# Patient Record
Sex: Female | Born: 1950 | Race: Black or African American | Hispanic: No | Marital: Single | Smoking: Never smoker
Health system: Southern US, Community
[De-identification: ages and names within clinical notes are randomized; demographics above are authoritative.]

## PROBLEM LIST (undated history)

## (undated) DIAGNOSIS — D649 Anemia, unspecified: Secondary | ICD-10-CM

## (undated) DIAGNOSIS — M1990 Unspecified osteoarthritis, unspecified site: Secondary | ICD-10-CM

## (undated) DIAGNOSIS — I10 Essential (primary) hypertension: Secondary | ICD-10-CM

## (undated) DIAGNOSIS — K219 Gastro-esophageal reflux disease without esophagitis: Secondary | ICD-10-CM

## (undated) HISTORY — DX: Anemia, unspecified: D64.9

## (undated) HISTORY — DX: Gastro-esophageal reflux disease without esophagitis: K21.9

## (undated) HISTORY — PX: DENTAL RESTORATION/EXTRACTION WITH X-RAY: SHX5796

## (undated) HISTORY — PX: COLONOSCOPY: SHX174

## (undated) HISTORY — PX: CHOLECYSTECTOMY: SHX55

## (undated) HISTORY — DX: Essential (primary) hypertension: I10

## (undated) HISTORY — PX: REPAIR PERONEAL TENDONS ANKLE: SUR1201

## (undated) HISTORY — PX: APPENDECTOMY: SHX54

## (undated) HISTORY — DX: Unspecified osteoarthritis, unspecified site: M19.90

## (undated) HISTORY — PX: UPPER GASTROINTESTINAL ENDOSCOPY: SHX188

---

## 1998-05-01 ENCOUNTER — Encounter: Payer: Self-pay | Admitting: Family Medicine

## 1998-05-01 ENCOUNTER — Ambulatory Visit (HOSPITAL_COMMUNITY): Admission: RE | Admit: 1998-05-01 | Discharge: 1998-05-01 | Payer: Self-pay | Admitting: Family Medicine

## 1998-05-11 ENCOUNTER — Ambulatory Visit (HOSPITAL_COMMUNITY): Admission: RE | Admit: 1998-05-11 | Discharge: 1998-05-11 | Payer: Self-pay | Admitting: Family Medicine

## 1999-06-22 ENCOUNTER — Emergency Department (HOSPITAL_COMMUNITY): Admission: EM | Admit: 1999-06-22 | Discharge: 1999-06-22 | Payer: Self-pay | Admitting: Emergency Medicine

## 2001-05-11 ENCOUNTER — Other Ambulatory Visit: Admission: RE | Admit: 2001-05-11 | Discharge: 2001-05-11 | Payer: Self-pay | Admitting: Obstetrics & Gynecology

## 2001-06-11 HISTORY — PX: TOTAL ABDOMINAL HYSTERECTOMY: SHX209

## 2001-06-18 ENCOUNTER — Inpatient Hospital Stay (HOSPITAL_COMMUNITY): Admission: RE | Admit: 2001-06-18 | Discharge: 2001-06-20 | Payer: Self-pay | Admitting: Obstetrics & Gynecology

## 2001-06-18 ENCOUNTER — Encounter (INDEPENDENT_AMBULATORY_CARE_PROVIDER_SITE_OTHER): Payer: Self-pay | Admitting: Specialist

## 2002-04-12 LAB — HM COLONOSCOPY: HM Colonoscopy: NORMAL

## 2004-01-17 ENCOUNTER — Emergency Department (HOSPITAL_COMMUNITY): Admission: EM | Admit: 2004-01-17 | Discharge: 2004-01-17 | Payer: Self-pay | Admitting: Emergency Medicine

## 2007-12-08 ENCOUNTER — Ambulatory Visit (HOSPITAL_COMMUNITY): Admission: RE | Admit: 2007-12-08 | Discharge: 2007-12-08 | Payer: Self-pay | Admitting: Internal Medicine

## 2008-12-29 ENCOUNTER — Ambulatory Visit (HOSPITAL_COMMUNITY): Admission: RE | Admit: 2008-12-29 | Discharge: 2008-12-29 | Payer: Self-pay | Admitting: Internal Medicine

## 2010-01-31 ENCOUNTER — Ambulatory Visit (HOSPITAL_COMMUNITY)
Admission: RE | Admit: 2010-01-31 | Discharge: 2010-01-31 | Payer: Self-pay | Source: Home / Self Care | Attending: Internal Medicine | Admitting: Internal Medicine

## 2010-01-31 LAB — HM MAMMOGRAPHY: HM Mammogram: NEGATIVE

## 2010-06-29 NOTE — Op Note (Signed)
[facility]  Patient:    Sierra Shields, Sierra Shields Visit Number: [id number] MRN: [id number]          Service Type: GYN Location: 9300 [id number] Attending Physician:  Lars Pinks Dictated by:   Caralyn Guile Arlyce Dice, M.D. Proc. Date: 06/18/01 Admit Date:  06/18/2001                             Operative Report  PREOPERATIVE DIAGNOSIS:       Myoma uteri and menorrhagia.  POSTOPERATIVE DIAGNOSIS:      Myoma uteri and menorrhagia.  OPERATION:                    Total abdominal hysterectomy and bilateral                               salpingo-oophorectomy.  SURGEON:                      Richard D. Arlyce Dice, M.D.  ASSISTANT:                    Luvenia Redden, M.D.  ANESTHESIA:                   General endotracheal.  ESTIMATED BLOOD LOSS:         200 cc.  FINDINGS:                     A large, approximately 20 week size uterus with multiple leiomyoma.  Tubes and ovaries appeared normal.  The pelvis and abdomen appeared normal.  INDICATIONS:                  This is a 60 year old gravida 2, para 1, who noted increasingly heavy periods over the last six to eight months.  On evaluation, she was found to have large uterine myoma.  The decision was made to do a hysterectomy because of the patients age and a prophylactic bilateral salpingo-oophorectomy will be performed as well to decrease the risk of later ovarian cancer.  DESCRIPTION OF PROCEDURE:     The patient was taken to the operating room and placed in the supine position.  General endotracheal anesthesia was induced. She was then placed in the frog-leg position.  The abdomen was the prepped and the bladder was catheterized.  The patient was then draped and the low transverse incision was made and carried down to the fascia which was extended transversely.  The rectus muscle was dissected free from the overlying rectus sheath and then divided in the midline where the peritoneum was then  entered bluntly and extended sharply in a vertical fashion.  The uterus was then delivered through the incision.  The round ligaments were identified bilaterally, ligated, and cut.  The bladder plane was then created in the anterior portion of the uterus.  The infundibulopelvic ligaments were isolated bilaterally, clamped, and doubly ligated.  The uterine arteries were then skeletonized, clamped, and ligated.  The fundus was then amputated from the cervical stump to aid in exposure.  At this point, the self-retaining retractor was placed, and the bowel was packed.  The paracervical tissues were clamped, cut, and ligated.  The vagina was entered sharply, and using the Satinsky scissors, the cervical stump was removed.  The vaginal angles were closed with pop-off sutures  and the vaginal cuff was then closed with figure-of-eight sutures.  Hemostasis was noted to be present.  The packs removed.  The self-retaining retractor was removed.  The peritoneum was closed along with the rectus muscle in the midline with running 0 Vicryl suture.  The fascia was closed with 0 Vicryl suture and the skin was closed with subcuticular 4-0 Vicryl suture.  The patient tolerated the procedure well and left the operating room in good condition. Dictated by:   Caralyn Guile Arlyce Dice, M.D. Attending Physician:  Lars Pinks DD:  06/18/01 TD:  06/20/01 Job: [phone]

## 2010-06-29 NOTE — Discharge Summary (Signed)
[facility]  Patient:    Sierra Shields, CARRETO Visit Number: [id number] MRN: [id number]          Service Type: GYN Location: 9300 [id number] Attending Physician:  Lars Pinks Dictated by:   Caralyn Guile Arlyce Dice, M.D. Admit Date:  06/18/2001 Discharge Date: 06/20/2001                             Discharge Summary  FINAL DIAGNOSES:              1. Myoma uteri.                               2. Chronic hypertension.  PROCEDURE:                    Total abdominal hysterectomy, bilateral                               salpingo-oophorectomy.  COMPLICATIONS:                None.  CONDITION ON DISCHARGE:       Improved.  HISTORY OF PRESENT ILLNESS:   This is a 60 year old, Gravida 2, Para 1, AB 1, who was admitted for total abdominal hysterectomy and bilateral salpingo-oophorectomy for the treatment of large symptomatic uterine myoma, menorrhagia and anemia.  The patient had noted increasingly heavy periods for the last six to eight months.  She was evaluated by her family doctor where myoma were identified.  These were confirmed by preoperative examination in my office.  HOSPITAL COURSE:              The patient was taken to the operating room on the day of admission where a total abdominal hysterectomy and bilateral salpingo-oophorectomy were performed by Dr. Ilda Mori with the assistance of Dr. Lodema Hong.  The surgery went without complication.  The patients postoperative course was benign without significant fever or anemia. On the second postoperative day, the patient was ambulating well.  Her pain was controlled with oral analgesic and she had had a bowel movement and was tolerating food without problem.  She was therefore felt to be ready for discharge.  She was discharged on a regular diet.  She was told to limit her activity.  She was given Tylox 30 tablets to take one to two every four hours for pain.  She was asked to take  over-the-counter iron supplementation and she was given Climara 0.1 mg patches to apply weekly.  She was asked to call the office to make an appointment for follow up in three to four weeks.  LABORATORY DATA:              Revealed an admission hemoglobin of 10.3 with a white count of 7600.  Her discharge hemoglobin was 7.6 with a white count of 7200.  Her preoperative chemistries were all normal, renal and liver function. Her urinalysis was negative.  Her pathology is pending at the time of this dictation. Dictated by:   Caralyn Guile Arlyce Dice, M.D. Attending Physician:  Lars Pinks DD:  06/20/01 TD:  06/23/01 Job: [id number]

## 2010-06-29 NOTE — H&P (Signed)
[facility]  Patient:    Sierra Shields, Sierra Shields Visit Number: [id number] MRN: [id number]          Service Type: GYN Location: 9300 [id number] Attending Physician:  Lars Pinks Dictated by:   Caralyn Guile Arlyce Dice, M.D. Admit Date:  06/18/2001                           History and Physical  CHIEF COMPLAINT:              Myoma uteri.  HISTORY OF PRESENT ILLNESS:   This is a 60 year old, gravida 2, para 1, AB 1, who is admitted for total abdominal hysterectomy and bilateral salpingo-oophorectomy for the treatment of large, symptomatic uterine myoma. The patient was seen in the office on May 11, 2001, with the complaints of abdominal pain and heavy periods that last two weeks.  The patient has been having this problem for several months.  The patient has seen her family doctor, who did an ultrasound which revealed significant myomas on the uterus.  PAST MEDICAL HISTORY:         Previous surgeries include gallstones for which she had cholecystectomy in April 1972.  Her medical problems include hypertension which is well-controlled on antihypertensive medication.  She has no known drug allergies.  She has never received blood.  FAMILY HISTORY:               Positive for diabetes in her mother and father and heart disease in her mother, and she has a strong family history of hypertension and has been diagnoses with this as well.  SOCIAL HISTORY:               She is a nonsmoker.  She drinks alcohol only occasionally.  She drinks 4-5 cups of coffee a day.  Denies IV drug use or the use of addicting drugs.  REVIEW OF SYSTEMS:            Positive for night sweats and weight changes. Otherwise is negative.  PHYSICAL EXAMINATION:  VITAL SIGNS:                  She is 5 feet 7 inches tall, 235 pounds, blood pressure 140/90.  ENT:                          Normal.  NECK:                         Supple without thyromegaly.  BREASTS:                       Normal without masses.  HEART:                        Regular sinus rhythm with murmurs or gallops.  LUNGS:                        Clear to auscultation and percussion.  ABDOMEN:                      A 20 week size myomatous uterus.  She had no lymphadenopathy.  EXTERNAL GENITALIA:           Normal.  PELVIC:  Vagina is normal.  Her cervix was difficult to visualize and was very high in the pelvis.  Her uterus was irregular consistent with large uterine myoma.  LABORATORY DATA:              Anemia with a hemoglobin of 9.9, and she had an MRI which showed leiomyoma uteri.  IMPRESSION:                   Symptomatic uterine myoma.  PLAN:                         Proceed with total abdominal hysterectomy. Discussion about bilateral salpingo-oophorectomy was undertaken with the patient, and the decision was made to proceed with bilateral salpingo-oophorectomy for ovarian cancer prophylaxis.  The risks of surgery were discussed with the patient, and she voiced understanding of these risks prior to surgery. Dictated by:   Caralyn Guile Arlyce Dice, M.D. Attending Physician:  Lars Pinks DD:  06/17/01 TD:  06/17/01 Job: [phone]

## 2010-10-22 LAB — BASIC METABOLIC PANEL: Glucose: 100 mg/dL

## 2011-01-08 ENCOUNTER — Other Ambulatory Visit: Payer: Self-pay | Admitting: Internal Medicine

## 2011-01-08 DIAGNOSIS — Z1231 Encounter for screening mammogram for malignant neoplasm of breast: Secondary | ICD-10-CM

## 2011-02-11 ENCOUNTER — Ambulatory Visit (HOSPITAL_COMMUNITY): Payer: BC Managed Care – PPO | Attending: Internal Medicine

## 2011-02-25 ENCOUNTER — Other Ambulatory Visit: Payer: Self-pay | Admitting: Emergency Medicine

## 2011-02-25 DIAGNOSIS — Z1231 Encounter for screening mammogram for malignant neoplasm of breast: Secondary | ICD-10-CM

## 2011-02-26 ENCOUNTER — Ambulatory Visit (HOSPITAL_COMMUNITY)
Admission: RE | Admit: 2011-02-26 | Discharge: 2011-02-26 | Disposition: A | Discharge status: Home / Self Care | Payer: BC Managed Care – PPO | Source: Ambulatory Visit | Attending: Emergency Medicine | Admitting: Emergency Medicine

## 2011-02-26 DIAGNOSIS — Z1231 Encounter for screening mammogram for malignant neoplasm of breast: Secondary | ICD-10-CM | POA: Insufficient documentation

## 2011-04-03 ENCOUNTER — Encounter: Payer: Self-pay | Admitting: *Deleted

## 2011-04-03 DIAGNOSIS — J309 Allergic rhinitis, unspecified: Secondary | ICD-10-CM | POA: Insufficient documentation

## 2011-04-03 DIAGNOSIS — K219 Gastro-esophageal reflux disease without esophagitis: Secondary | ICD-10-CM

## 2011-04-03 DIAGNOSIS — I10 Essential (primary) hypertension: Secondary | ICD-10-CM

## 2011-04-03 DIAGNOSIS — D649 Anemia, unspecified: Secondary | ICD-10-CM | POA: Insufficient documentation

## 2011-04-04 ENCOUNTER — Encounter: Payer: Self-pay | Admitting: Family Medicine

## 2011-04-04 ENCOUNTER — Ambulatory Visit: Payer: BC Managed Care – PPO

## 2011-04-04 ENCOUNTER — Ambulatory Visit (INDEPENDENT_AMBULATORY_CARE_PROVIDER_SITE_OTHER): Payer: BC Managed Care – PPO | Admitting: Family Medicine

## 2011-04-04 VITALS — BP 147/87 | HR 66 | Temp 97.7°F | Resp 16 | Ht 65.25 in | Wt 298.8 lb

## 2011-04-04 DIAGNOSIS — M1712 Unilateral primary osteoarthritis, left knee: Secondary | ICD-10-CM | POA: Insufficient documentation

## 2011-04-04 DIAGNOSIS — M25569 Pain in unspecified knee: Secondary | ICD-10-CM

## 2011-04-04 DIAGNOSIS — M5137 Other intervertebral disc degeneration, lumbosacral region: Secondary | ICD-10-CM

## 2011-04-04 DIAGNOSIS — Z1211 Encounter for screening for malignant neoplasm of colon: Secondary | ICD-10-CM

## 2011-04-04 DIAGNOSIS — M1990 Unspecified osteoarthritis, unspecified site: Secondary | ICD-10-CM

## 2011-04-04 DIAGNOSIS — M1711 Unilateral primary osteoarthritis, right knee: Secondary | ICD-10-CM | POA: Insufficient documentation

## 2011-04-04 DIAGNOSIS — Z8639 Personal history of other endocrine, nutritional and metabolic disease: Secondary | ICD-10-CM

## 2011-04-04 DIAGNOSIS — Z0000 Encounter for general adult medical examination without abnormal findings: Secondary | ICD-10-CM

## 2011-04-04 DIAGNOSIS — E669 Obesity, unspecified: Secondary | ICD-10-CM

## 2011-04-04 DIAGNOSIS — I10 Essential (primary) hypertension: Secondary | ICD-10-CM

## 2011-04-04 LAB — POCT URINALYSIS DIPSTICK
Ketones, UA: NEGATIVE
Leukocytes, UA: NEGATIVE
Nitrite, UA: NEGATIVE
Protein, UA: 30
Urobilinogen, UA: 0.2
pH, UA: 6

## 2011-04-04 LAB — LIPID PANEL
HDL: 54 mg/dL (ref >39)
Total CHOL/HDL Ratio: 3 Ratio
Triglycerides: 132 mg/dL (ref <150)

## 2011-04-04 LAB — BASIC METABOLIC PANEL
BUN: 16 mg/dL (ref 6–23)
CO2: 28 mEq/L (ref 19–32)
Calcium: 10.2 mg/dL (ref 8.4–10.5)
Creat: 0.76 mg/dL (ref 0.50–1.10)
Glucose, Bld: 95 mg/dL (ref 70–99)
Sodium: 139 mEq/L (ref 135–145)

## 2011-04-04 MED ORDER — TRAMADOL HCL 50 MG PO TABS: 50.0000 mg | ORAL_TABLET | Freq: Three times a day (TID) | ORAL | Status: AC | PRN

## 2011-04-04 NOTE — Patient Instructions (Signed)

## 2011-04-04 NOTE — Progress Notes (Signed)
Subjective:    Patient ID: Sierra Shields, female    DOB: Jul 22, 1950, 61 y.o.   MRN: [id number]  HPI   This 61 y.o AA female presents for physical exam with specific concerns regarding obesity and chronic  knee pain. She has HTN , is compliant with medications and is having no chest pain, dizziness or palpitations.  She is requesting a specific diet pill that she and her daughter have researched; attempts to lose weight in the past   have included Weight Watchers ( pt lost 30 lbs but regained it) and Slim Fast. She has always regained any  weight that she lost, even with an exercise program. She used to do aquatic aerobics but developed a skin infection  and once that was cleared, she lost her enthusiasm for that form of exercise. She is very limited now because of  bilateral knee pain; she works as a Production designer, theatre/television/film at [employer] x 19 years and has joint pain related to her work.   She wears compression hose and orthotics but has persistent pain, not relieved by current NSAIDs. In the  past, she has tried Celebrex, Meloxicam and Naproxen without success. She sees an Orthopedist for upper  extremity problem and ankle pain but never had her knees evaluated.  Review of Systems  Constitutional: Positive for activity change and unexpected weight change. Negative for appetite change.  HENT: Positive for congestion. Negative for rhinorrhea and postnasal drip.   Respiratory: Negative.   Gastrointestinal:       Frequent indigestion/ heartburn  Genitourinary:       Night sweats   Musculoskeletal: Positive for arthralgias and gait problem. Negative for back pain.       Lower ext weakness because of knee pain Joint pain in hands and feet as well stiffness  Neurological: Positive for numbness. Negative for dizziness, syncope and headaches.       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   Psychiatric/Behavioral: Negative.        Objective:   Physical Exam  Nursing note and vitals  reviewed. Constitutional: She is oriented to person, place, and time. She appears well-developed and well-nourished. No distress.       Morbidly obese  HENT:  Head: Normocephalic and atraumatic.  Mouth/Throat: Oropharynx is clear and moist.  Eyes: Conjunctivae and EOM are normal. Pupils are equal, round, and reactive to light. No scleral icterus.  Neck: Normal range of motion. Neck supple. No thyromegaly present.  Cardiovascular: Normal rate, regular rhythm, normal heart sounds and intact distal pulses.  Exam reveals no gallop.   No murmur heard. Pulmonary/Chest: Effort normal and breath sounds normal. No respiratory distress. She has no wheezes. She has no rales.  Abdominal: Soft. Bowel sounds are normal. She exhibits no mass. There is no tenderness.       Exam difficult due to obesity  Musculoskeletal: She exhibits tenderness. She exhibits no edema.       Both knees: diffuse tenderness bilaterally, effusion along                     Inferior-medial aspect of patellae.No redness                     or warmth   Lymphadenopathy:    She has no cervical adenopathy.  Neurological: She is alert and oriented to person, place, and time. She has normal reflexes. No cranial nerve deficit. Coordination normal.  Skin: Skin is warm and dry.  Psychiatric: She has a normal mood and affect. Her behavior is normal. Thought content normal.    Results for orders placed in visit on 04/04/11  POCT URINALYSIS DIPSTICK      Component Value Range   Color, UA yellow     Clarity, UA clear     Glucose, UA neg     Bilirubin, UA neg     Ketones, UA neg     Spec Grav, UA 1.025     Blood, UA neg     pH, UA 6.0     Protein, UA 30     Urobilinogen, UA 0.2     Nitrite, UA neg     Leukocytes, UA Negative      ECG: NSR; No STTW abnormalities/ no ectopy   [facility] reading (PRIMARY) by  Dr. Audria Nine: Bilateral degenerative changes with narrowed joint space                                                                              and with spurring      Assessment & Plan:   1. Routine general medical examination at a health care facility  Lipid panel, Urinalysis Dipstick  2. DJD (degenerative joint disease)  DG Knee 1-2 Views Left, DG Knee 1-2 Views Right, Ambulatory referral to Orthopedic Surgery Rx: Tramadol HCl 50 mg 1 tab q 8 hours sprn D/C Nabumetone (ineffective)  3. Obesity, Class III, BMI 40-49.9 (morbid obesity)  Amb ref to Medical Nutrition Therapy-MNT Encouraged resumption of exercise most days of the week- start with seated exercises to work large muscle groups Advised against "Diet pill" but she might consider researching "Sensa" or ask Nutrition specialist about it  4. HTN (hypertension)  EKG 12-Lead, Basic metabolic panel; continue current medications  5. H/O vitamin D deficiency  Vitamin D, 25-hydroxy Vit D  (04/20/09: Vit D= 19)  6. Screening for colorectal cancer  Hemoccult - Multi Cards (take home)

## 2011-04-05 LAB — VITAMIN D 25 HYDROXY (VIT D DEFICIENCY, FRACTURES): Vit D, 25-Hydroxy: 32 ng/mL (ref 30–89)

## 2011-04-09 NOTE — Progress Notes (Signed)
Quick Note:  Please notify pt that results are normal. ______ 

## 2011-04-10 ENCOUNTER — Other Ambulatory Visit: Payer: Self-pay

## 2011-04-10 DIAGNOSIS — Z1211 Encounter for screening for malignant neoplasm of colon: Secondary | ICD-10-CM

## 2011-04-10 LAB — POC HEMOCCULT BLD/STL (HOME/3-CARD/SCREEN): Card #1 Date: NEGATIVE

## 2011-05-06 ENCOUNTER — Encounter: Payer: Self-pay | Admitting: *Deleted

## 2011-05-06 ENCOUNTER — Encounter: Payer: BC Managed Care – PPO | Attending: Family Medicine | Admitting: *Deleted

## 2011-05-06 VITALS — Ht 65.0 in | Wt 297.2 lb

## 2011-05-06 DIAGNOSIS — Z713 Dietary counseling and surveillance: Secondary | ICD-10-CM | POA: Insufficient documentation

## 2011-05-06 DIAGNOSIS — I10 Essential (primary) hypertension: Secondary | ICD-10-CM | POA: Insufficient documentation

## 2011-05-06 NOTE — Progress Notes (Signed)
  Medical Nutrition Therapy:  Appt start time: 0830 end time:  0930.  Assessment:  Primary concerns today: patient here for assistance with obestiy. She works as Production designer, theatre/television/film for [employer], who she has worked for for 18 years. She currently works 8 PM to 4 AM Tuesday through Saturday, off on Sunday and Monday. She lives in home with her daughter and 59 year old grandson. They both grocery shop and prepare evening meal. Her food intake is not structured, so diet history difficult to determine. She states history of multiple diets over the years including Weight Watchers, Slim Fast, Nutri-System etc. She states Nutri-System was most effective due to set shake at 2 meals and normal 3 meal, so fewer choices to make.  MEDICATIONS: see list   DIETARY INTAKE:  Usual eating pattern includes 2-3 meals and 2-3 snacks per day.  Everyday foods include all food groups but milk.  Avoided foods include green peas, fluid milk due to lactose intolerance .    24-hr recall:  B ( AM): eggs, bacon OR pick up fast food burrito, hot tea with sugar x 4-5 packets  Snk ( AM): chips, fruit ...  L ( PM): supper with family- meat, starch, vegetables, salad often, small amount of dressing, tea with sugar Snk ( PM):   D ( PM):   Snk ( PM): occasional Beverages: tea, water, Vita-water Rarely eats at work  Usual physical activity: on feet at work x 8 hours, housework, play with grandson on Sunday afternoons  Estimated energy needs: 1600 calories 180 g carbohydrates 120 g protein 44 g fat  Progress Towards Goal(s):  In progress.   Nutritional Diagnosis:  NI-1.5 Excessive energy intake As related to obesity.  As evidenced by BMI of 49.6% .    Intervention:  Nutrition counseling provided on energy level of macro-nutrients and how to provide better balance for weight loss. Suggested use of nutrient supplement such as Boost or Ensure for 1-2 meals per day, which she seemed interested in trying. Also suggested increase in  activity level through ArmChair exercises beginning with 5 minutes per day every day to increase metabolism in her own home and with concerns about arthritis. Final suggestion was to consider non-calorie beverages in place of sweet tea. Plan: Aim for 3 Carb Choices per meal, 0-2 per snack if hungry Allow leeway of 1 carb each direction as needed Consider use of liquid supplement such as Boost or Ensure for 1-2 meals a day as desired Armchair exercise 5 minutes every day, increase as tolerated  Handouts given during visit include:  Carb Counting and Food Label handout  Meal Plan Card  Monitoring/Evaluation:  Dietary intake, Arm Chair exercise, and body weight in 4 weeks. Patient to call for appointment once she checks her calendar at home.

## 2011-05-06 NOTE — Patient Instructions (Signed)
Plan: Aim for 3 Carb Choices per meal, 0-2 per snack if hungry Allow leeway of 1 carb each direction as needed Consider use of liquid supplement such as Boost or Ensure for 1-2 meals a day as desired Armchair exercise 5 minutes every day, increase as tolerated

## 2011-06-04 ENCOUNTER — Other Ambulatory Visit: Payer: Self-pay | Admitting: Physician Assistant

## 2011-06-17 ENCOUNTER — Telehealth: Payer: Self-pay

## 2011-06-17 ENCOUNTER — Other Ambulatory Visit: Payer: Self-pay | Admitting: Family Medicine

## 2011-06-17 NOTE — Telephone Encounter (Signed)
PT STATES SHE IS OUT OF ALL HER MEDICINES AND WAS TOLD BY [facility] THAT THEY ARE NOT DOING HER REQUEST UNTIL THEY HERE FROM HER DR. PLEASE CALL [phone]   [facility]

## 2011-06-18 NOTE — Telephone Encounter (Signed)
Called pt and she stated that she called [facility] back and they are supposed to be sending a request for her meds after all. She needs her Lisinopril - HCTZ 20-12.5 QD (she is only getting 7-10 days from [facility] to hold her until [facility] sends), metoprolol 50 BID, amlodipine 5 QD, prilosec 20 QD, nabumetone 750 BID (Dr Cleta Alberts Rxd this for her knee - it really helps her walk), and tramadol 50 Q 8 hrs prn pain. She requests we make sure that [facility] sends all requests. Dr Audria Nine, are these OK to RF? What # on Tramadol?

## 2011-06-18 NOTE — Telephone Encounter (Signed)
Yes, these are all OK to RF ( 6 months for all BP meds and Prilosec, 3 months for Nabumetone and Tramadol # 90 with 1 RF).

## 2011-06-19 MED ORDER — METOPROLOL TARTRATE 50 MG PO TABS: 50.0000 mg | ORAL_TABLET | Freq: Two times a day (BID) | ORAL | Status: DC

## 2011-06-19 MED ORDER — LISINOPRIL-HYDROCHLOROTHIAZIDE 20-12.5 MG PO TABS: 1.0000 | ORAL_TABLET | Freq: Every day | ORAL | Status: DC

## 2011-06-19 MED ORDER — TRAMADOL HCL 50 MG PO TABS: 50.0000 mg | ORAL_TABLET | Freq: Three times a day (TID) | ORAL | Status: AC | PRN

## 2011-06-19 MED ORDER — AMLODIPINE BESYLATE 5 MG PO TABS: 5.0000 mg | ORAL_TABLET | Freq: Every day | ORAL | Status: DC

## 2011-06-19 MED ORDER — NABUMETONE 750 MG PO TABS: 750.0000 mg | ORAL_TABLET | Freq: Two times a day (BID) | ORAL | Status: DC

## 2011-06-19 MED ORDER — OMEPRAZOLE 20 MG PO CPDR: 20.0000 mg | DELAYED_RELEASE_CAPSULE | Freq: Every day | ORAL | Status: DC

## 2011-06-19 NOTE — Telephone Encounter (Signed)
Have not seen a request for pts meds from [facility], so I am sending them in as requested, and approved by Dr Audria Nine

## 2011-07-01 ENCOUNTER — Other Ambulatory Visit: Payer: Self-pay

## 2011-07-01 MED ORDER — METOPROLOL TARTRATE 50 MG PO TABS: 50.0000 mg | ORAL_TABLET | Freq: Two times a day (BID) | ORAL | Status: DC

## 2011-07-01 MED ORDER — AMLODIPINE BESYLATE 5 MG PO TABS: 5.0000 mg | ORAL_TABLET | Freq: Every day | ORAL | Status: DC

## 2011-07-01 MED ORDER — NABUMETONE 750 MG PO TABS: 750.0000 mg | ORAL_TABLET | Freq: Two times a day (BID) | ORAL | Status: DC

## 2011-07-01 MED ORDER — LISINOPRIL-HYDROCHLOROTHIAZIDE 20-12.5 MG PO TABS: 1.0000 | ORAL_TABLET | Freq: Every day | ORAL | Status: DC

## 2011-09-02 ENCOUNTER — Other Ambulatory Visit: Payer: Self-pay | Admitting: Physician Assistant

## 2011-09-24 ENCOUNTER — Other Ambulatory Visit: Payer: Self-pay | Admitting: Family Medicine

## 2011-12-01 ENCOUNTER — Other Ambulatory Visit: Payer: Self-pay | Admitting: Family Medicine

## 2011-12-31 ENCOUNTER — Ambulatory Visit (INDEPENDENT_AMBULATORY_CARE_PROVIDER_SITE_OTHER): Payer: BC Managed Care – PPO | Admitting: Family Medicine

## 2011-12-31 VITALS — BP 136/84 | HR 83 | Temp 98.1°F | Resp 16 | Ht 65.5 in | Wt 286.2 lb

## 2011-12-31 DIAGNOSIS — Z23 Encounter for immunization: Secondary | ICD-10-CM

## 2011-12-31 DIAGNOSIS — M170 Bilateral primary osteoarthritis of knee: Secondary | ICD-10-CM

## 2011-12-31 DIAGNOSIS — E669 Obesity, unspecified: Secondary | ICD-10-CM

## 2011-12-31 DIAGNOSIS — K219 Gastro-esophageal reflux disease without esophagitis: Secondary | ICD-10-CM

## 2011-12-31 DIAGNOSIS — I10 Essential (primary) hypertension: Secondary | ICD-10-CM

## 2011-12-31 LAB — LIPID PANEL
HDL: 49 mg/dL (ref >39)
Triglycerides: 112 mg/dL (ref <150)

## 2011-12-31 LAB — COMPREHENSIVE METABOLIC PANEL
Albumin: 4.4 g/dL (ref 3.5–5.2)
BUN: 11 mg/dL (ref 6–23)
Calcium: 9.7 mg/dL (ref 8.4–10.5)
Chloride: 104 mEq/L (ref 96–112)
Glucose, Bld: 94 mg/dL (ref 70–99)
Potassium: 4 mEq/L (ref 3.5–5.3)

## 2011-12-31 LAB — POCT CBC
Granulocyte percent: 62.8 %G (ref 37–80)
HCT, POC: 44.2 % (ref 37.7–47.9)
MCV: 97.7 fL — AB (ref 80–97)
POC LYMPH PERCENT: 28.4 %L (ref 10–50)
RBC: 4.52 M/uL (ref 4.04–5.48)
RDW, POC: 14.1 %
WBC: 9 10*3/uL (ref 4.6–10.2)

## 2011-12-31 MED ORDER — LISINOPRIL-HYDROCHLOROTHIAZIDE 20-12.5 MG PO TABS: 1.0000 | ORAL_TABLET | Freq: Every day | ORAL | Status: DC

## 2011-12-31 MED ORDER — POTASSIUM CHLORIDE ER 10 MEQ PO TBCR: 10.0000 meq | EXTENDED_RELEASE_TABLET | Freq: Every day | ORAL | Status: DC

## 2011-12-31 MED ORDER — OMEPRAZOLE 20 MG PO CPDR: 20.0000 mg | DELAYED_RELEASE_CAPSULE | Freq: Every day | ORAL | Status: DC

## 2011-12-31 MED ORDER — METOPROLOL TARTRATE 50 MG PO TABS: 50.0000 mg | ORAL_TABLET | Freq: Two times a day (BID) | ORAL | Status: DC

## 2011-12-31 MED ORDER — TRAMADOL HCL 50 MG PO TABS: 50.0000 mg | ORAL_TABLET | Freq: Two times a day (BID) | ORAL | Status: DC | PRN

## 2011-12-31 MED ORDER — AMLODIPINE BESYLATE 5 MG PO TABS: 5.0000 mg | ORAL_TABLET | Freq: Every day | ORAL | Status: DC

## 2011-12-31 NOTE — Progress Notes (Signed)
[facility] [address], [place] [address] [phone]  Date:  12/31/2011   Name:  Sierra Shields   DOB:  Jun 19, 1950   MRN:  [id number]  PCP:  Dow Adolph, MD    Chief Complaint: Arthritis and Medication Refill   History of Present Illness:  Sierra Shields is a 61 y.o. very pleasant female patient who presents with the following:  Last visit with [place] in February.  She has a history of obesity and chronic knee pain.    She has tried celebrex, mobic and naproxen in the past.  At her last visit she weighed 298 lbs- today 286!  She is surprised that she had lost weight.   She has tried a lot of methods to lose weight but has not had much luck so far.  She is interested in finding out more about bariatric surgery Knee x-rays 04/04/11: LEFT KNEE - 1-2 VIEW  Comparison: None  Findings: There are tricompartmental degenerative changes most  notable in the medial compartment. No acute fracture or  osteochondral abnormality. Possible small joint effusion.  IMPRESSION:  Tricompartmental degenerative changes.   RIGHT KNEE - 1-2 VIEW  Comparison: None.  Findings: There are tricompartmental degenerative changes with  joint space narrowing and osteophytic spurring. This is most  significant in the medial compartment. No acute bony abnormality  or osteochondral lesion. A small joint effusion is suspected.  IMPRESSION:  Tricompartmental degenerative changes  After her last visit she was started on tramadol prn for her pain, and referred to ortho and nutrition to help with weight loss.    Today she wants to have her medications refilled.  She is still having knee pain.  She does use a knee sleeve sometimes- this does help her some.  However, her old sleeve is getting worn out and she wonders if she could have 2 of these.  She did see ortho- she went to [facility] and saw someone (she is not sure who).  However, as this point she does not want to try  anything such as a knee scope and did not yet have an injection  She is fasting today so we will do labs.  She takes tramadol for her knee pain- she takes it BID and it is helpful    Patient Active Problem List  Diagnosis  . Hypertension  . Anemia  . GERD (gastroesophageal reflux disease)  . Allergic rhinitis  . Obesity, Class III, BMI 40-49.9 (morbid obesity)  . Left knee DJD  . Right knee DJD    Past Medical History  Diagnosis Date  . Hypertension   . Anemia   . GERD (gastroesophageal reflux disease)   . Allergic rhinitis     Past Surgical History  Procedure Date  . Appendectomy   . Cholecystectomy   . Total abdominal hysterectomy 06/2001    History  Substance Use Topics  . Smoking status: Never Smoker   . Smokeless tobacco: Not on file  . Alcohol Use: No     Comment: special occasions-rare    Family History  Problem Relation Age of Onset  . Diabetes Mother   . Hypertension Mother   . Diabetes Father   . Hypertension    . Heart failure Sister     Allergies  Allergen Reactions  . Ampicillin     Causes blisters  . Penicillins Hives  . Latex Rash    Medication list has been reviewed and updated.  Current Outpatient Prescriptions on  File Prior to Visit  Medication Sig Dispense Refill  . amLODipine (NORVASC) 5 MG tablet Take 1 tablet (5 mg total) by mouth daily.  90 tablet  2  . aspirin 81 MG tablet Take 81 mg by mouth daily.      Marland Kitchen atenolol (TENORMIN) 50 MG tablet Take 50 mg by mouth daily.      Marland Kitchen BLACK COHOSH PO Take by mouth daily.      Marland Kitchen KLOR-CON 10 10 MEQ tablet TAKE 1 TABLET DAILY  90 tablet  1  . lisinopril-hydrochlorothiazide (PRINZIDE,ZESTORETIC) 20-12.5 MG per tablet Take 1 tablet by mouth daily.  90 tablet  2  . metoprolol (LOPRESSOR) 50 MG tablet Take 1 tablet (50 mg total) by mouth 2 (two) times daily.  180 tablet  2  . metoprolol (LOPRESSOR) 50 MG tablet TAKE 1 TABLET TWICE A DAY  180 tablet  0  . Misc Natural Products (GLUCOSAMINE  CHOND COMPLEX/MSM PO) Take by mouth.      . nabumetone (RELAFEN) 750 MG tablet Take 1 tablet (750 mg total) by mouth 2 (two) times daily.  180 tablet  2  . azelastine (ASTELIN) 137 MCG/SPRAY nasal spray Place 1 spray into the nose 2 (two) times daily. Use in each nostril as directed      . omeprazole (PRILOSEC) 20 MG capsule Take 1 capsule (20 mg total) by mouth daily.  90 capsule  1    Review of Systems:  As per HPI- otherwise negative.   Physical Examination: Filed Vitals:   12/31/11 0811  BP: 136/84  Pulse: 83  Temp: 98.1 F (36.7 C)  Resp: 16   Filed Vitals:   12/31/11 0811  Height: 5' 5.5" (1.664 m)  Weight: 286 lb 3.2 oz (129.819 kg)   Body mass index is 46.90 kg/(m^2). Ideal Body Weight: Weight in (lb) to have BMI = 25: 152.2   GEN: WDWN, NAD, Non-toxic, A & O x 3, morbid obesity HEENT: Atraumatic, Normocephalic. Neck supple. No masses, No LAD.  Bilateral TM wnl, oropharynx normal.  PEERL,EOMI.   Ears and Nose: No external deformity. CV: RRR, No M/G/R. No JVD. No thrill. No extra heart sounds. PULM: CTA B, no wheezes, crackles, rhonchi. No retractions. No resp. distress. No accessory muscle use. EXTR: No c/c/e.  Bilateral knees: mild crepitus, full ROM, no effusion apparent but knees are obese NEURO Normal gait.  PSYCH: Normally interactive. Conversant. Not depressed or anxious appearing.  Calm demeanor.   Results for orders placed in visit on 12/31/11  POCT CBC      Component Value Range   WBC 9.0  4.6 - 10.2 K/uL   Lymph, poc 2.6  0.6 - 3.4   POC LYMPH PERCENT 28.4  10 - 50 %L   MID (cbc) 0.8  0 - 0.9   POC MID % 8.8  0 - 12 %M   POC Granulocyte 5.7  2 - 6.9   Granulocyte percent 62.8  37 - 80 %G   RBC 4.52  4.04 - 5.48 M/uL   Hemoglobin 13.5  12.2 - 16.2 g/dL   HCT, POC 40.9  81.1 - 47.9 %   MCV 97.7 (*) 80 - 97 fL   MCH, POC 29.9  27 - 31.2 pg   MCHC 30.5 (*) 31.8 - 35.4 g/dL   RDW, POC 91.4     Platelet Count, POC 351  142 - 424 K/uL   MPV 9.5  0 -  99.8 fL    Assessment and Plan:  1. HTN (hypertension)  POCT CBC, Comprehensive metabolic panel, TSH, amLODipine (NORVASC) 5 MG tablet, potassium chloride (KLOR-CON 10) 10 MEQ tablet, lisinopril-hydrochlorothiazide (PRINZIDE,ZESTORETIC) 20-12.5 MG per tablet, metoprolol (LOPRESSOR) 50 MG tablet  2. Obesity  Lipid panel  3. Arthritis of both knees  traMADol (ULTRAM) 50 MG tablet  4. GERD (gastroesophageal reflux disease)  omeprazole (PRILOSEC) 20 MG capsule     COPLAND,JESSICA, MD

## 2011-12-31 NOTE — Patient Instructions (Addendum)
If you would like to learn more about weight- loss surgery, call or go online and look at the [facility] bariatric center:  Register today online or contact [facility] at [phone] to sign up for our next Bariatric Surgery Options Seminar.  I will be in touch regarding your labs.  Please come and see [place] in about 6 months for a recheck.

## 2012-01-02 ENCOUNTER — Encounter: Payer: Self-pay | Admitting: Family Medicine

## 2012-01-17 ENCOUNTER — Ambulatory Visit (INDEPENDENT_AMBULATORY_CARE_PROVIDER_SITE_OTHER): Payer: BC Managed Care – PPO | Admitting: Family Medicine

## 2012-01-17 VITALS — BP 131/82 | HR 73 | Temp 98.1°F | Resp 16 | Ht 66.0 in | Wt 288.4 lb

## 2012-01-17 DIAGNOSIS — E669 Obesity, unspecified: Secondary | ICD-10-CM

## 2012-01-17 NOTE — Progress Notes (Signed)
[facility] [address] [place] [address] [phone]  Date:  01/17/2012   Name:  Sierra Shields   DOB:  July 19, 1950   MRN:  [id number]  PCP:  Dow Adolph, MD    Chief Complaint: Follow-up   History of Present Illness:  Sierra Shields is a 61 y.o. very pleasant female patient who presents with the following:  She is here today to discuss weight loss.  She has thought about weight loss surgery, but does not think this is for her at this time.  However, she is going to attend the Bariatric surgery center seminar next week to see what she can find out.   In the past she had thought about a diet pill, but ended up seeing a nutritionist. She has tried several different diet plans in the past without much success.  She is here today because she had heard about a diet pill called Qsymia on TV and on Dr. Neil Crouch and wondered if this might be a good option for her.   Patient Active Problem List  Diagnosis  . Hypertension  . Anemia  . GERD (gastroesophageal reflux disease)  . Allergic rhinitis  . Obesity, Class III, BMI 40-49.9 (morbid obesity)  . Left knee DJD  . Right knee DJD    Past Medical History  Diagnosis Date  . Hypertension   . Anemia   . GERD (gastroesophageal reflux disease)   . Allergic rhinitis     Past Surgical History  Procedure Date  . Appendectomy   . Cholecystectomy   . Total abdominal hysterectomy 06/2001    History  Substance Use Topics  . Smoking status: Never Smoker   . Smokeless tobacco: Not on file  . Alcohol Use: No     Comment: special occasions-rare    Family History  Problem Relation Age of Onset  . Diabetes Mother   . Hypertension Mother   . Diabetes Father   . Hypertension    . Heart failure Sister     Allergies  Allergen Reactions  . Ampicillin     Causes blisters  . Penicillins Hives  . Latex Rash    Medication list has been reviewed and updated.  Current Outpatient Prescriptions on File  Prior to Visit  Medication Sig Dispense Refill  . amLODipine (NORVASC) 5 MG tablet Take 1 tablet (5 mg total) by mouth daily.  90 tablet  3  . aspirin 81 MG tablet Take 81 mg by mouth daily.      Marland Kitchen BLACK COHOSH PO Take by mouth daily.      Marland Kitchen lisinopril-hydrochlorothiazide (PRINZIDE,ZESTORETIC) 20-12.5 MG per tablet Take 1 tablet by mouth daily.  90 tablet  3  . metoprolol (LOPRESSOR) 50 MG tablet Take 1 tablet (50 mg total) by mouth 2 (two) times daily.  180 tablet  3  . Misc Natural Products (GLUCOSAMINE CHOND COMPLEX/MSM PO) Take by mouth.      Marland Kitchen omeprazole (PRILOSEC) 20 MG capsule Take 1 capsule (20 mg total) by mouth daily.  90 capsule  3  . potassium chloride (KLOR-CON 10) 10 MEQ tablet Take 1 tablet (10 mEq total) by mouth daily.  90 tablet  3  . traMADol (ULTRAM) 50 MG tablet Take 1 tablet (50 mg total) by mouth 2 (two) times daily as needed for pain.  180 tablet  1    Review of Systems:  As per HPI- otherwise negative.   Physical Examination: Filed Vitals:   01/17/12 0102  BP: 131/82  Pulse: 73  Temp: 98.1 F (36.7 C)  Resp: 16   Filed Vitals:   01/17/12 0819  Height: 5\' 6"  (1.676 m)  Weight: 288 lb 6.4 oz (130.817 kg)   Body mass index is 46.55 kg/(m^2). Ideal Body Weight: Weight in (lb) to have BMI = 25: 154.6    GEN: WDWN, NAD, Non-toxic, Alert & Oriented x 3, morbid obesity HEENT: Atraumatic, Normocephalic.  Ears and Nose: No external deformity. CV: RRR, no MRG Pulm: CTA bilaterally  EXTR: No clubbing/cyanosis/edema NEURO: Normal gait.  PSYCH: Normally interactive. Conversant. Not depressed or anxious appearing.  Calm demeanor.    Assessment and Plan: 1. Obesity    Discussed with Dora.  I do not think that Qsymia is a good idea as it could exacerbate her HTN.  I personally do not use this medication and have never written for it.  Encouraged her to go ahead and attend the bariatric center seminar, as they do have more to offer than just surgery. It could  be that a doctor there would have the experience needed to use a weight loss medication    COPLAND,JESSICA, MD

## 2012-05-15 ENCOUNTER — Ambulatory Visit: Payer: BC Managed Care – PPO

## 2012-05-15 ENCOUNTER — Ambulatory Visit (INDEPENDENT_AMBULATORY_CARE_PROVIDER_SITE_OTHER): Payer: BC Managed Care – PPO | Admitting: Family Medicine

## 2012-05-15 VITALS — BP 144/100 | HR 72 | Temp 98.1°F | Resp 20 | Ht 65.5 in | Wt 287.0 lb

## 2012-05-15 DIAGNOSIS — M549 Dorsalgia, unspecified: Secondary | ICD-10-CM

## 2012-05-15 DIAGNOSIS — R109 Unspecified abdominal pain: Secondary | ICD-10-CM

## 2012-05-15 DIAGNOSIS — R319 Hematuria, unspecified: Secondary | ICD-10-CM

## 2012-05-15 LAB — POCT URINALYSIS DIPSTICK
Blood, UA: NEGATIVE
Ketones, UA: NEGATIVE
Protein, UA: NEGATIVE
Spec Grav, UA: 1.02
Urobilinogen, UA: 0.2

## 2012-05-15 LAB — POCT UA - MICROSCOPIC ONLY
Crystals, Ur, HPF, POC: NEGATIVE
Mucus, UA: NEGATIVE

## 2012-05-15 MED ORDER — HYDROCODONE-ACETAMINOPHEN 5-325 MG PO TABS: 2.0000 | ORAL_TABLET | ORAL | Status: DC | PRN

## 2012-05-15 MED ORDER — KETOROLAC TROMETHAMINE 60 MG/2ML IM SOLN
60.0000 mg | Freq: Once | INTRAMUSCULAR | Status: AC
  Administered 2012-05-15: 60 mg via INTRAMUSCULAR

## 2012-05-15 NOTE — Progress Notes (Signed)
Subjective:    Patient ID: Sierra Shields, female    DOB: Dec 09, 1950, 62 y.o.   MRN: [id number]  HPI Sierra Shields is a 62 y.o. female  Worked usual shift at [employer] last night. No new activities, NKI, and no pain when went to sleep at 4:30am. Woke up with pain in mid to lower back at 6:00 am.  Catching/sharp pain.  No radiation to legs. No bowel or bladder incontinence, no saddle anesthesia, no lower extremity weakness. No hematuria, urinary urgency, frequency, or dysuria and is making urine normally. No abd pain, no chest pain, not dyspneic, but feels like pain takes breath away. No prior similar sx's. No known hx of nephrolithiasis. Tried (2) Ultram 50mg  at 11:30 am without relief.    FH of nephrolithiasis - mom and aunts with kidney stone.  Tx: tramadol x2 @11 :30am - no relief.   Results for orders placed in visit on 12/31/11  COMPREHENSIVE METABOLIC PANEL      Result Value Range   Sodium 143  135 - 145 mEq/L   Potassium 4.0  3.5 - 5.3 mEq/L   Chloride 104  96 - 112 mEq/L   CO2 28  19 - 32 mEq/L   Glucose, Bld 94  70 - 99 mg/dL   BUN 11  6 - 23 mg/dL   Creat 6.21  3.08 - 6.57 mg/dL   Total Bilirubin 0.5  0.3 - 1.2 mg/dL   Alkaline Phosphatase 76  39 - 117 U/L   AST 14  0 - 37 U/L   ALT 17  0 - 35 U/L   Total Protein 7.4  6.0 - 8.3 g/dL   Albumin 4.4  3.5 - 5.2 g/dL   Calcium 9.7  8.4 - 84.6 mg/dL  LIPID PANEL      Result Value Range   Cholesterol 166  0 - 200 mg/dL   Triglycerides 962  <952 mg/dL   HDL 49  >84 mg/dL   Total CHOL/HDL Ratio 3.4     VLDL 22  0 - 40 mg/dL   LDL Cholesterol 95  0 - 99 mg/dL  TSH      Result Value Range   TSH 0.634  0.350 - 4.500 uIU/mL  POCT CBC      Result Value Range   WBC 9.0  4.6 - 10.2 K/uL   Lymph, poc 2.6  0.6 - 3.4   POC LYMPH PERCENT 28.4  10 - 50 %L   MID (cbc) 0.8  0 - 0.9   POC MID % 8.8  0 - 12 %M   POC Granulocyte 5.7  2 - 6.9   Granulocyte percent 62.8  37 - 80 %G   RBC 4.52  4.04 - 5.48 M/uL   Hemoglobin 13.5   12.2 - 16.2 g/dL   HCT, POC 13.2  44.0 - 47.9 %   MCV 97.7 (*) 80 - 97 fL   MCH, POC 29.9  27 - 31.2 pg   MCHC 30.5 (*) 31.8 - 35.4 g/dL   RDW, POC 10.2     Platelet Count, POC 351  142 - 424 K/uL   MPV 9.5  0 - 99.8 fL    Review of Systems  Constitutional: Negative for fever and chills.  Respiratory: Negative for chest tightness and shortness of breath.   Cardiovascular: Negative for chest pain.  Gastrointestinal: Negative for nausea, vomiting, abdominal pain, diarrhea and blood in stool.  Genitourinary: Positive for flank pain. Negative for dysuria, urgency, frequency, hematuria,  decreased urine volume, difficulty urinating and pelvic pain.  Musculoskeletal: Positive for myalgias, back pain (no radiation. ) and arthralgias. Negative for gait problem.  Skin: Negative for rash.  Neurological: Negative for weakness.       Objective:   Physical Exam  Vitals reviewed. Constitutional: She is oriented to person, place, and time. She appears well-developed and well-nourished.  HENT:  Head: Normocephalic and atraumatic.  Cardiovascular: Normal rate, regular rhythm, normal heart sounds and intact distal pulses.   Pulmonary/Chest: Effort normal and breath sounds normal.  Abdominal: Soft. Normal appearance. She exhibits no distension. There is no tenderness. There is no rebound, no guarding and no CVA tenderness.  Musculoskeletal: Normal range of motion.       Lumbar back: She exhibits normal range of motion, no tenderness and no bony tenderness.       Back:  Neurological: She is alert and oriented to person, place, and time.  Skin: Skin is warm.  Psychiatric: She has a normal mood and affect. Her behavior is normal.    Results for orders placed in visit on 05/15/12  POCT URINALYSIS DIPSTICK      Result Value Range   Color, UA YELLOW     Clarity, UA CLEAR     Glucose, UA NEG     Bilirubin, UA NEG     Ketones, UA NEG     Spec Grav, UA 1.020     Blood, UA NEG     pH, UA 5.5      Protein, UA NEG     Urobilinogen, UA 0.2     Nitrite, UA NEG     Leukocytes, UA Negative    POCT UA - MICROSCOPIC ONLY      Result Value Range   WBC, Ur, HPF, POC 0-2     RBC, urine, microscopic 2-4     Bacteria, U Microscopic NEG     Mucus, UA NEG     Epithelial cells, urine per micros 1-2     Crystals, Ur, HPF, POC NEG     Casts, Ur, LPF, POC NEG     Yeast, UA NEG      [facility] reading (PRIMARY) by  Dr. Neva Seat: LS spine:slight DDD, no acute fx.   1 view abdomen: ?9mm nephrolith on R side lateral to spinous process.    toradol 60mg  injection given at approx.1600     Assessment & Plan:  Sierra Shields is a 62 y.o. female Back pain, acute - Plan: POCT urinalysis dipstick, POCT UA - Microscopic Only, DG Lumbar Spine 2-3 Views, DG Abd 1 View, ketorolac (TORADOL) injection 60 mg, HYDROcodone-acetaminophen (NORCO/VICODIN) 5-325 MG per tablet  Flank pain - Plan: HYDROcodone-acetaminophen (NORCO/VICODIN) 5-325 MG per tablet, Urine culture  Hematuria - Plan: HYDROcodone-acetaminophen (NORCO/VICODIN) 5-325 MG per tablet, Urine culture   Microscopic hematuria and acute onset of back pain.  Not reproducible with rom/back exam. Will check urine cx, toradol given (BP better last ov, and creat ok  - suspect pain component), and lortab 1-2 Q4h prn. Recheck tomorrow with Dr. Alwyn Ren - sooner or to ER if worse. Understanding expressed.   Patient Instructions  Drink plenty of fluids, hydrocodone - 1-2 every 4 to 6 hours if needed for pain. Do not combine with tramadol.  Use the filter when urinating and bring in stone if one passed. Recheck with Dr. Alwyn Ren tomorrow after 10am.  Return to the clinic or go to the nearest emergency room if any of your symptoms worsen or new symptoms  occur.

## 2012-05-15 NOTE — Patient Instructions (Signed)
Drink plenty of fluids, hydrocodone - 1-2 every 4 to 6 hours if needed for pain. Do not combine with tramadol.  Use the filter when urinating and bring in stone if one passed. Recheck with Dr. Alwyn Ren tomorrow after 10am.  Return to the clinic or go to the nearest emergency room if any of your symptoms worsen or new symptoms occur.

## 2012-05-16 ENCOUNTER — Ambulatory Visit: Payer: BC Managed Care – PPO

## 2012-05-16 ENCOUNTER — Ambulatory Visit (INDEPENDENT_AMBULATORY_CARE_PROVIDER_SITE_OTHER): Payer: BC Managed Care – PPO | Admitting: Family Medicine

## 2012-05-16 ENCOUNTER — Emergency Department (HOSPITAL_COMMUNITY)
Admission: EM | Admit: 2012-05-16 | Discharge: 2012-05-16 | Discharge status: Against Medical Advice | Payer: BC Managed Care – PPO | Attending: Emergency Medicine | Admitting: Emergency Medicine

## 2012-05-16 ENCOUNTER — Emergency Department (HOSPITAL_COMMUNITY)
Admission: RE | Admit: 2012-05-16 | Discharge: 2012-05-16 | Disposition: A | Discharge status: Home / Self Care | Payer: BC Managed Care – PPO | Source: Ambulatory Visit | Attending: Family Medicine | Admitting: Family Medicine

## 2012-05-16 VITALS — BP 134/82 | HR 75 | Temp 98.2°F | Resp 17 | Ht 66.0 in | Wt 289.0 lb

## 2012-05-16 DIAGNOSIS — M549 Dorsalgia, unspecified: Secondary | ICD-10-CM

## 2012-05-16 DIAGNOSIS — R3129 Other microscopic hematuria: Secondary | ICD-10-CM

## 2012-05-16 DIAGNOSIS — Z5320 Procedure and treatment not carried out because of patient's decision for unspecified reasons: Secondary | ICD-10-CM | POA: Insufficient documentation

## 2012-05-16 LAB — COMPREHENSIVE METABOLIC PANEL
ALT: 18 U/L (ref 0–35)
AST: 16 U/L (ref 0–37)
Alkaline Phosphatase: 74 U/L (ref 39–117)
Calcium: 9.3 mg/dL (ref 8.4–10.5)
Chloride: 103 mEq/L (ref 96–112)
Creat: 0.78 mg/dL (ref 0.50–1.10)

## 2012-05-16 LAB — POCT CBC
Granulocyte percent: 67.9 %G (ref 37–80)
MCH, POC: 31.5 pg — AB (ref 27–31.2)
MID (cbc): 0.8 (ref 0–0.9)
MPV: 9.8 fL (ref 0–99.8)
POC MID %: 7.7 %M (ref 0–12)
Platelet Count, POC: 287 10*3/uL (ref 142–424)
RBC: 4.1 M/uL (ref 4.04–5.48)
WBC: 9.9 10*3/uL (ref 4.6–10.2)

## 2012-05-16 LAB — POCT URINALYSIS DIPSTICK
Blood, UA: NEGATIVE
Nitrite, UA: NEGATIVE
Protein, UA: NEGATIVE
Urobilinogen, UA: 0.2
pH, UA: 5.5

## 2012-05-16 LAB — POCT UA - MICROSCOPIC ONLY
Casts, Ur, LPF, POC: NEGATIVE
Crystals, Ur, HPF, POC: NEGATIVE
Yeast, UA: NEGATIVE

## 2012-05-16 MED ORDER — METAXALONE 800 MG PO TABS: 800.0000 mg | ORAL_TABLET | Freq: Three times a day (TID) | ORAL | Status: DC

## 2012-05-16 NOTE — Patient Instructions (Signed)
Please go to CT -register in the ER at [facility] but let them know you are there for a radiology test.  When your test is done you may go home and I will give you a call

## 2012-05-16 NOTE — ED Notes (Signed)
CT called to reports PT is an out PT order.

## 2012-05-16 NOTE — Progress Notes (Signed)
[facility] [address], [place] [address] [phone]  Date:  05/16/2012   Name:  Sierra Shields   DOB:  20-Sep-1950   MRN:  [id number]  PCP:  Dow Adolph, MD    Chief Complaint: Follow-up   History of Present Illness:  Sierra Shields is a 62 y.o. very pleasant female patient who presents with the following:  She is here today to recheck possible kidney stones.  She was here yesterday with flank/ lower back pain and microhematuria, treat ed with toradol and rx for hydrocodone.   She has pain on both sides of her back.  It is located in the thoracic area.   The hydrocodone pills help for about 4 hours, then the pain returns She does not note any blood in her urine She has not caught anything in her urine screen.   She has never had any back pain in the past.   No dysuria- urine cultrue is pending from yesterday  No CP, no SOB.  VS are normal and reassuring.  No abdominal pain  She cannot recall any unusual activities or injury Patient Active Problem List  Diagnosis  . Hypertension  . Anemia  . GERD (gastroesophageal reflux disease)  . Allergic rhinitis  . Obesity, Class III, BMI 40-49.9 (morbid obesity)  . Left knee DJD  . Right knee DJD    Past Medical History  Diagnosis Date  . Hypertension   . Anemia   . GERD (gastroesophageal reflux disease)   . Allergic rhinitis     Past Surgical History  Procedure Laterality Date  . Appendectomy    . Cholecystectomy    . Total abdominal hysterectomy  06/2001    History  Substance Use Topics  . Smoking status: Never Smoker   . Smokeless tobacco: Not on file  . Alcohol Use: No     Comment: special occasions-rare    Family History  Problem Relation Age of Onset  . Diabetes Mother   . Hypertension Mother   . Diabetes Father   . Hypertension    . Heart failure Sister     Allergies  Allergen Reactions  . Ampicillin     Causes blisters  . Penicillins Hives  . Latex Rash     Medication list has been reviewed and updated.  Current Outpatient Prescriptions on File Prior to Visit  Medication Sig Dispense Refill  . amLODipine (NORVASC) 5 MG tablet Take 1 tablet (5 mg total) by mouth daily.  90 tablet  3  . aspirin 81 MG tablet Take 81 mg by mouth daily.      Marland Kitchen BLACK COHOSH PO Take by mouth daily.      Marland Kitchen HYDROcodone-acetaminophen (NORCO/VICODIN) 5-325 MG per tablet Take 2 tablets by mouth every 4 (four) hours as needed for pain.  20 tablet  0  . lisinopril-hydrochlorothiazide (PRINZIDE,ZESTORETIC) 20-12.5 MG per tablet Take 1 tablet by mouth daily.  90 tablet  3  . metoprolol (LOPRESSOR) 50 MG tablet Take 1 tablet (50 mg total) by mouth 2 (two) times daily.  180 tablet  3  . Misc Natural Products (GLUCOSAMINE CHOND COMPLEX/MSM PO) Take by mouth.      Marland Kitchen omeprazole (PRILOSEC) 20 MG capsule Take 1 capsule (20 mg total) by mouth daily.  90 capsule  3  . potassium chloride (KLOR-CON 10) 10 MEQ tablet Take 1 tablet (10 mEq total) by mouth daily.  90 tablet  3  . traMADol (ULTRAM) 50 MG tablet Take  1 tablet (50 mg total) by mouth 2 (two) times daily as needed for pain.  180 tablet  1   No current facility-administered medications on file prior to visit.    Review of Systems:  As per HPI- otherwise negative.   Physical Examination: Filed Vitals:   05/16/12 1130  BP: 134/82  Pulse: 75  Temp: 98.2 F (36.8 C)  Resp: 17   Filed Vitals:   05/16/12 1130  Height: 5\' 6"  (1.676 m)  Weight: 289 lb (131.09 kg)   Body mass index is 46.67 kg/(m^2). Ideal Body Weight: Weight in (lb) to have BMI = 25: 154.6  GEN: WDWN, NAD, Non-toxic, A & O x 3, obese HEENT: Atraumatic, Normocephalic. Neck supple. No masses, No LAD. Ears and Nose: No external deformity. CV: RRR, No M/G/R. No JVD. No thrill. No extra heart sounds. PULM: CTA B, no wheezes, crackles, rhonchi. No retractions. No resp. distress. No accessory muscle use. ABD: S, NT, ND. No rebound. No HSM. EXTR: No  c/c/e NEURO Normal gait.  PSYCH: Normally interactive. Conversant. Not depressed or anxious appearing.  Calm demeanor.  She states that her back hurts over the bilateral paraspinous muscles in the lower thoracic/ upper lumbar area.  She is able to flex and extend her back and does not have any bony tenderness.  Pressing on the muscles does not seem to reproduce the pain.  Normal LE strength and sensation, normal patellar DTR  [facility] reading (PRIMARY) by  Dr. Patsy Lager. CXR: obese, otherwise negative  CHEST - 2 VIEW  Comparison: 04/22/07  Findings: The heart size and mediastinal contours are within normal limits. Both lungs are clear. The visualized skeletal structures are unremarkable.  IMPRESSION: Negative examination.   Results for orders placed in visit on 05/16/12  POCT CBC      Result Value Range   WBC 9.9  4.6 - 10.2 K/uL   Lymph, poc 2.4  0.6 - 3.4   POC LYMPH PERCENT 24.4  10 - 50 %L   MID (cbc) 0.8  0 - 0.9   POC MID % 7.7  0 - 12 %M   POC Granulocyte 6.7  2 - 6.9   Granulocyte percent 67.9  37 - 80 %G   RBC 4.10  4.04 - 5.48 M/uL   Hemoglobin 12.9  12.2 - 16.2 g/dL   HCT, POC 21.3  08.6 - 47.9 %   MCV 96.9  80 - 97 fL   MCH, POC 31.5 (*) 27 - 31.2 pg   MCHC 32.5  31.8 - 35.4 g/dL   RDW, POC 57.8     Platelet Count, POC 287  142 - 424 K/uL   MPV 9.8  0 - 99.8 fL  POCT UA - MICROSCOPIC ONLY      Result Value Range   WBC, Ur, HPF, POC 0-3     RBC, urine, microscopic 2-5     Bacteria, U Microscopic 2+     Mucus, UA large     Epithelial cells, urine per micros 1-4     Crystals, Ur, HPF, POC neg     Casts, Ur, LPF, POC neg     Yeast, UA neg    POCT URINALYSIS DIPSTICK      Result Value Range   Color, UA yellow     Clarity, UA clear     Glucose, UA neg     Bilirubin, UA neg     Ketones, UA neg     Spec Grav, UA 1.025  Blood, UA neg     pH, UA 5.5     Protein, UA neg     Urobilinogen, UA 0.2     Nitrite, UA neg     Leukocytes, UA Negative       Assessment and Plan: Back pain - Plan: POCT CBC, Comprehensive metabolic panel, POCT UA - Microscopic Only, POCT urinalysis dipstick, DG Chest 2 View  Microhematuria - Plan: CT Abdomen Pelvis Wo Contrast  Back pain and microhematuria. Possible kidney stone but this diagnosis is not certain.  She continues to complain of mid- back pain that has not gotten better with vicodin.  Will send for a CT scan to determine if she truly has kidney stones.  She does have bacteria in her urine, but her symptoms do not suggest UTI.  Urine culture is pending and should be back by tomorrow.  Will plan further follow- up pending labs.   Signed Abbe Amsterdam, MD  CT ABDOMEN AND PELVIS WITHOUT CONTRAST  Technique: Multidetector CT imaging of the abdomen and pelvis was performed following the standard protocol without intravenous contrast.  Comparison: 05/15/2012  Findings:  Lung bases: The lung bases are clear. No pericardial or pleural effusion noted.  Kidneys/Ureters/Bladder: No evidence for nephrolithiasis or hydronephrosis. No hydroureter or ureterolithiasis identified. The urinary bladder appears normal. Previous hysterectomy.  Other: There is no focal liver abnormality. Previous cholecystectomy. No biliary dilatation. The pancreas is unremarkable. The spleen is negative. Normal caliber of the abdominal aorta. No aneurysm. No enlarged upper abdominal lymph nodes. There is no small bowel mesenteric adenopathy. No pelvic or inguinal adenopathy.  The stomach appears normal. The small bowel loops have a normal course and caliber. Normal appearance of the colon.  No free fluid or fluid collection within the upper abdomen or the pelvis.  Bones/Musculoskeletal: Review of the visualized osseous structures is significant for mild ventral endplate spurring within the lumbar spine. A small umbilical hernia is identified which contains fat only.  IMPRESSION:  1. No acute findings. 2. No  evidence for nephrolithiasis or obstructive uropathy.   Called to discuss with her- she does not have apparent kidney stones.  Suspect her pain is muscular.  Will try treating with skelaxin- cautioned against using this with the norco as excessive sedation could result.  She will try this and let me know if not better in the next couple of days- Sooner if worse.  Await urine culture result

## 2012-05-17 LAB — URINE CULTURE: Colony Count: 50000

## 2012-05-18 ENCOUNTER — Telehealth: Payer: Self-pay

## 2012-05-18 DIAGNOSIS — M549 Dorsalgia, unspecified: Secondary | ICD-10-CM

## 2012-05-18 DIAGNOSIS — R319 Hematuria, unspecified: Secondary | ICD-10-CM

## 2012-05-18 DIAGNOSIS — R109 Unspecified abdominal pain: Secondary | ICD-10-CM

## 2012-05-18 MED ORDER — HYDROCODONE-ACETAMINOPHEN 5-325 MG PO TABS: 1.0000 | ORAL_TABLET | ORAL | Status: DC | PRN

## 2012-05-18 NOTE — Telephone Encounter (Signed)
PATIENT WAS SEEN ON Saturday 05/16/2012 AND STATES THAT SHE WAS TOLD IF SHE IS NOT FEELING ANY BETTER TO CALL THE OFFICE. PATIENT STATES SHE IS STILL HAVING THE SAME SYMPTOMS AND WOULD TO KNOW WHAT SHE CAN DO.  [phone]

## 2012-05-18 NOTE — Telephone Encounter (Signed)
Called her back- her back is still hurting a lot.  The skelaxin does not help her- she is just taking the vicodin because the skelaxin does not change her pain.  She continues to have severe pain that comes back after 3 or 4 hours.  A heating pad does help and sometimes heat can actually resolve her pain. No CP or SOB, no abdominal pain  Asked her to please come in tomorrow for a recheck- she may need an MRI.  Also noted that recent urine culture grew out mixed bacteria- this needs a repeat.  Asked her to write this down and request a repeat urine culture tomorrow. This may be sent to my attention  Refilled her vicodin- called in #[phone] 5/325

## 2012-05-26 ENCOUNTER — Other Ambulatory Visit: Payer: Self-pay | Admitting: Family Medicine

## 2012-05-26 DIAGNOSIS — Z1231 Encounter for screening mammogram for malignant neoplasm of breast: Secondary | ICD-10-CM

## 2012-05-28 ENCOUNTER — Telehealth: Payer: Self-pay

## 2012-05-28 NOTE — Telephone Encounter (Signed)
Pt is calling back because she received a phone call last week saying that she had Bacteria in her urine and was suppose to have a Dr. Call her back but she has yet to hear back If someone could give her a call back at [phone]

## 2012-05-28 NOTE — Telephone Encounter (Signed)
Spoke to her, she is advised to come in to see you, as you had advised in the past, she is going to try to come to see you Friday. FYI to you.

## 2012-06-02 ENCOUNTER — Other Ambulatory Visit: Payer: Self-pay | Admitting: Family Medicine

## 2012-06-02 DIAGNOSIS — M549 Dorsalgia, unspecified: Secondary | ICD-10-CM

## 2012-06-02 NOTE — Telephone Encounter (Signed)
Dr Patsy Lager, do you want to RF, or see pt again for RFs?

## 2012-06-02 NOTE — Telephone Encounter (Signed)
Patient said her pharmacy has faxed over 3 times for a medication refill she has not heard anything from [place]

## 2012-06-03 ENCOUNTER — Telehealth: Payer: Self-pay

## 2012-06-03 ENCOUNTER — Ambulatory Visit (HOSPITAL_COMMUNITY)
Admission: RE | Admit: 2012-06-03 | Discharge: 2012-06-03 | Disposition: A | Discharge status: Home / Self Care | Payer: BC Managed Care – PPO | Source: Ambulatory Visit | Attending: Family Medicine | Admitting: Family Medicine

## 2012-06-03 DIAGNOSIS — Z1231 Encounter for screening mammogram for malignant neoplasm of breast: Secondary | ICD-10-CM | POA: Insufficient documentation

## 2012-06-03 NOTE — Telephone Encounter (Signed)
Pt states that she called yesterday but she is needing to know something about a prescription Call back number is [phone] [facility]acy she uses is [facility] [address]

## 2012-06-03 NOTE — Telephone Encounter (Signed)
[facility]. Patient asked for it to be called in. Left message at pharmacy. Pt aware.

## 2012-06-09 ENCOUNTER — Encounter: Payer: Self-pay | Admitting: Family Medicine

## 2012-06-09 ENCOUNTER — Other Ambulatory Visit: Payer: Self-pay | Admitting: Family Medicine

## 2012-06-09 DIAGNOSIS — R3129 Other microscopic hematuria: Secondary | ICD-10-CM

## 2012-06-10 ENCOUNTER — Other Ambulatory Visit: Payer: Self-pay | Admitting: Physician Assistant

## 2012-06-10 ENCOUNTER — Other Ambulatory Visit: Payer: Self-pay | Admitting: Radiology

## 2012-06-10 DIAGNOSIS — R319 Hematuria, unspecified: Secondary | ICD-10-CM

## 2012-07-11 ENCOUNTER — Telehealth: Payer: Self-pay

## 2012-07-11 DIAGNOSIS — K219 Gastro-esophageal reflux disease without esophagitis: Secondary | ICD-10-CM

## 2012-07-11 DIAGNOSIS — I10 Essential (primary) hypertension: Secondary | ICD-10-CM

## 2012-07-11 NOTE — Telephone Encounter (Signed)
Pt states she recently changed her insurance and its affecting her rx refills  Please call for rx details - pt wasn't sure  Pharmacy: [facility]  ([facility] and because of ins change she is totally out of her rx's)

## 2012-07-13 ENCOUNTER — Telehealth: Payer: Self-pay

## 2012-07-13 MED ORDER — OMEPRAZOLE 20 MG PO CPDR: 20.0000 mg | DELAYED_RELEASE_CAPSULE | Freq: Every day | ORAL | Status: DC

## 2012-07-13 MED ORDER — METOPROLOL TARTRATE 50 MG PO TABS: 50.0000 mg | ORAL_TABLET | Freq: Two times a day (BID) | ORAL | Status: DC

## 2012-07-13 MED ORDER — POTASSIUM CHLORIDE ER 10 MEQ PO TBCR: 10.0000 meq | EXTENDED_RELEASE_TABLET | Freq: Every day | ORAL | Status: DC

## 2012-07-13 NOTE — Telephone Encounter (Signed)
See previous phone message. 

## 2012-07-13 NOTE — Telephone Encounter (Signed)
Pt CB and stated that she needs 90 day supplies sent to [facility] of her Klor-con, metoprolol and omeprazole. I advised pt that she is due for 6 mos checkup and pt stated she will come in this month for f/up if we can go ahead and send in a 90 day RFs of each.

## 2012-07-13 NOTE — Telephone Encounter (Signed)
lmom to cb with names of meds

## 2012-07-13 NOTE — Telephone Encounter (Signed)
Patient is in need of prescription refills. Has called 2x and has not gotten a call back. [phone]

## 2012-08-17 ENCOUNTER — Other Ambulatory Visit: Payer: Self-pay | Admitting: Family Medicine

## 2012-08-17 ENCOUNTER — Ambulatory Visit (INDEPENDENT_AMBULATORY_CARE_PROVIDER_SITE_OTHER): Payer: BC Managed Care – PPO | Admitting: Family Medicine

## 2012-08-17 ENCOUNTER — Ambulatory Visit: Payer: BC Managed Care – PPO

## 2012-08-17 VITALS — BP 136/82 | HR 64 | Temp 97.9°F | Resp 18 | Ht 66.0 in | Wt 278.9 lb

## 2012-08-17 DIAGNOSIS — K219 Gastro-esophageal reflux disease without esophagitis: Secondary | ICD-10-CM

## 2012-08-17 DIAGNOSIS — R319 Hematuria, unspecified: Secondary | ICD-10-CM

## 2012-08-17 DIAGNOSIS — R109 Unspecified abdominal pain: Secondary | ICD-10-CM

## 2012-08-17 DIAGNOSIS — M549 Dorsalgia, unspecified: Secondary | ICD-10-CM

## 2012-08-17 DIAGNOSIS — M25561 Pain in right knee: Secondary | ICD-10-CM

## 2012-08-17 DIAGNOSIS — I10 Essential (primary) hypertension: Secondary | ICD-10-CM

## 2012-08-17 DIAGNOSIS — M25569 Pain in unspecified knee: Secondary | ICD-10-CM

## 2012-08-17 MED ORDER — POTASSIUM CHLORIDE ER 10 MEQ PO TBCR: 10.0000 meq | EXTENDED_RELEASE_TABLET | Freq: Every day | ORAL | Status: DC

## 2012-08-17 MED ORDER — AMLODIPINE BESYLATE 5 MG PO TABS: 5.0000 mg | ORAL_TABLET | Freq: Every day | ORAL | Status: DC

## 2012-08-17 MED ORDER — OMEPRAZOLE 20 MG PO CPDR: 20.0000 mg | DELAYED_RELEASE_CAPSULE | Freq: Every day | ORAL | Status: DC

## 2012-08-17 MED ORDER — NABUMETONE 750 MG PO TABS: ORAL_TABLET | ORAL | Status: DC

## 2012-08-17 MED ORDER — LISINOPRIL-HYDROCHLOROTHIAZIDE 20-12.5 MG PO TABS: 1.0000 | ORAL_TABLET | Freq: Every day | ORAL | Status: DC

## 2012-08-17 MED ORDER — HYDROCODONE-ACETAMINOPHEN 5-325 MG PO TABS: 1.0000 | ORAL_TABLET | Freq: Four times a day (QID) | ORAL | Status: DC | PRN

## 2012-08-17 MED ORDER — METOPROLOL TARTRATE 50 MG PO TABS: 50.0000 mg | ORAL_TABLET | Freq: Two times a day (BID) | ORAL | Status: DC

## 2012-08-17 NOTE — Patient Instructions (Addendum)
Use the hydrocodone as needed for pain, but remember it can make you feel sleepy.  Do not combine it with the tramadol/ ultram.   We will get you referred to see an orthopedist as soon as possible.  If any problems in the meantime please let us know Let's check you back for a BP check/ labs in 5- 6 months.

## 2012-08-17 NOTE — Progress Notes (Signed)
[facility] [address] [place] [address] [phone]  Date:  08/17/2012   Name:  Sierra Shields   DOB:  1950-11-18   MRN:  [id number]  PCP:  Dow Adolph, MD    Chief Complaint: Knee Pain and Medication Refill   History of Present Illness:  Sierra Shields is a 62 y.o. very pleasant female patient who presents with the following:  She needs refills of her medications today.  She is having pain in her right knee- more bothersome for about 3 days.   It hurts more after she gets out of bed in the morning.  She has been noted to have degenerative changes in her knees on films 03/2011.  She is supposed to go to [place] next month- to [place]. She is worried about all the walking that she will need to do.    She had pain in her knees last year when she had the x-rays taken.  Since then she has intermittently had some pain.   She has used a knee sleeve in the past which was helpful.   The knee is popping and cracking- this is not new.   It will sometimes give way from under her.  She is careful because she feels like she might fall sometimes.    She had labs done in April of this year.  She is established at [employer].  Morning appt is better.   Patient Active Problem List   Diagnosis Date Noted  . Obesity, Class III, BMI 40-49.9 (morbid obesity) 04/04/2011  . Left knee DJD 04/04/2011  . Right knee DJD 04/04/2011  . Hypertension   . Anemia   . GERD (gastroesophageal reflux disease)   . Allergic rhinitis     Past Medical History  Diagnosis Date  . Hypertension   . Anemia   . GERD (gastroesophageal reflux disease)   . Allergic rhinitis     Past Surgical History  Procedure Laterality Date  . Appendectomy    . Cholecystectomy    . Total abdominal hysterectomy  06/2001    History  Substance Use Topics  . Smoking status: Never Smoker   . Smokeless tobacco: Not on file  . Alcohol Use: No     Comment: special occasions-rare     Family History  Problem Relation Age of Onset  . Diabetes Mother   . Hypertension Mother   . Diabetes Father   . Hypertension    . Heart failure Sister     Allergies  Allergen Reactions  . Ampicillin     Causes blisters  . Penicillins Hives  . Latex Rash    Medication list has been reviewed and updated.  Current Outpatient Prescriptions on File Prior to Visit  Medication Sig Dispense Refill  . amLODipine (NORVASC) 5 MG tablet Take 1 tablet (5 mg total) by mouth daily.  90 tablet  3  . aspirin 81 MG tablet Take 81 mg by mouth daily.      Marland Kitchen lisinopril-hydrochlorothiazide (PRINZIDE,ZESTORETIC) 20-12.5 MG per tablet Take 1 tablet by mouth daily.  90 tablet  3  . metaxalone (SKELAXIN) 800 MG tablet TAKE 1 TABLET (800 MG TOTAL) BY MOUTH 3 (THREE) TIMES DAILY. AS NEEDED FOR MUSCLE SPASM  30 tablet  0  . metoprolol (LOPRESSOR) 50 MG tablet Take 1 tablet (50 mg total) by mouth 2 (two) times daily. PATIENT NEEDS OFFICE VISIT FOR ADDITIONAL REFILLS  180 tablet  0  . Misc Natural Products (  GLUCOSAMINE CHOND COMPLEX/MSM PO) Take by mouth.      . nabumetone (RELAFEN) 750 MG tablet TAKE 1 TABLET TWICE A DAY  180 tablet  0  . omeprazole (PRILOSEC) 20 MG capsule Take 1 capsule (20 mg total) by mouth daily. PATIENT NEEDS OFFICE VISIT FOR ADDITIONAL REFILLS  90 capsule  0  . potassium chloride (KLOR-CON 10) 10 MEQ tablet Take 1 tablet (10 mEq total) by mouth daily. PATIENT NEEDS OFFICE VISIT FOR ADDITIONAL REFILLS  90 tablet  0  . traMADol (ULTRAM) 50 MG tablet Take 1 tablet (50 mg total) by mouth 2 (two) times daily as needed for pain.  180 tablet  1  . BLACK COHOSH PO Take by mouth daily.      Marland Kitchen HYDROcodone-acetaminophen (NORCO/VICODIN) 5-325 MG per tablet Take 1 tablet by mouth every 4 (four) hours as needed for pain.  20 tablet  0   No current facility-administered medications on file prior to visit.    Review of Systems:  As per HPI- otherwise negative.   Physical  Examination: Filed Vitals:   08/17/12 1156  BP: 136/82  Pulse: 64  Temp: 97.9 F (36.6 C)  Resp: 18   Filed Vitals:   08/17/12 1156  Height: 5\' 6"  (1.676 m)  Weight: 278 lb 14.4 oz (126.508 kg)   Body mass index is 45.04 kg/(m^2). Ideal Body Weight: Weight in (lb) to have BMI = 25: 154.6  GEN: WDWN, NAD, Non-toxic, A & O x 3, obese HEENT: Atraumatic, Normocephalic. Neck supple. No masses, No LAD. Ears and Nose: No external deformity. CV: RRR, No M/G/R. No JVD. No thrill. No extra heart sounds. PULM: CTA B, no wheezes, crackles, rhonchi. No retractions. No resp. distress. No accessory muscle use. ABD: S, NT, ND, +BS. No rebound. No HSM. EXTR: No c/c/e NEURO Normal gait.  PSYCH: Normally interactive. Conversant. Not depressed or anxious appearing.  Calm demeanor.  Both knee show crepitus Right knee; tenderness at the medial and lateral joint line, and in the posterior knee.  Ligaments are stable, no significant effusion  [facility] reading (PRIMARY) by  Dr. Patsy Lager. Right knee: degenerative changes, appear stable from last films.    Comparison: 04/04/2011  Findings: Moderate to advanced degenerative change in the medial joint compartment with joint space narrowing and spurring, unchanged. There is widening of the lateral joint space. There is moderate degenerative change of the patellofemoral joint with joint space narrowing and spurring. There is a probable joint effusion.  Negative for fracture.  IMPRESSION: Moderate degenerative change, unchanged from the prior study.  Clinically significant discrepancy from primary report, if provided: None   Assessment and Plan: Knee pain, right - Plan: DG Knee AP/LAT W/Sunrise Right, nabumetone (RELAFEN) 750 MG tablet, Ambulatory referral to Orthopedic Surgery, CANCELED: DG Knee Complete 4 Views Right  HTN (hypertension) - Plan: potassium chloride (KLOR-CON 10) 10 MEQ tablet, metoprolol (LOPRESSOR) 50 MG tablet,  lisinopril-hydrochlorothiazide (PRINZIDE,ZESTORETIC) 20-12.5 MG per tablet, amLODipine (NORVASC) 5 MG tablet  GERD (gastroesophageal reflux disease) - Plan: omeprazole (PRILOSEC) 20 MG capsule  Back pain, acute - Plan: HYDROcodone-acetaminophen (NORCO/VICODIN) 5-325 MG per tablet, DISCONTINUED: HYDROcodone-acetaminophen (NORCO/VICODIN) 5-325 MG per tablet  Flank pain - Plan: HYDROcodone-acetaminophen (NORCO/VICODIN) 5-325 MG per tablet, DISCONTINUED: HYDROcodone-acetaminophen (NORCO/VICODIN) 5-325 MG per tablet  Hematuria - Plan: HYDROcodone-acetaminophen (NORCO/VICODIN) 5-325 MG per tablet, DISCONTINUED: HYDROcodone-acetaminophen (NORCO/VICODIN) 5-325 MG per tablet  Refilled her medications today for HTN/ potassium and GERD.  Made appt for [facility] this week.  Refilled her vicodin to use prn  knee pain  Signed Abbe Amsterdam, MD

## 2012-10-09 ENCOUNTER — Emergency Department (HOSPITAL_COMMUNITY)
Admission: EM | Admit: 2012-10-09 | Discharge: 2012-10-09 | Disposition: A | Discharge status: Home / Self Care | Payer: Worker's Compensation | Attending: Emergency Medicine | Admitting: Emergency Medicine

## 2012-10-09 ENCOUNTER — Encounter (HOSPITAL_COMMUNITY): Payer: Self-pay | Admitting: *Deleted

## 2012-10-09 DIAGNOSIS — K219 Gastro-esophageal reflux disease without esophagitis: Secondary | ICD-10-CM | POA: Insufficient documentation

## 2012-10-09 DIAGNOSIS — Y929 Unspecified place or not applicable: Secondary | ICD-10-CM | POA: Insufficient documentation

## 2012-10-09 DIAGNOSIS — Z79899 Other long term (current) drug therapy: Secondary | ICD-10-CM | POA: Insufficient documentation

## 2012-10-09 DIAGNOSIS — I10 Essential (primary) hypertension: Secondary | ICD-10-CM | POA: Insufficient documentation

## 2012-10-09 DIAGNOSIS — Z7982 Long term (current) use of aspirin: Secondary | ICD-10-CM | POA: Insufficient documentation

## 2012-10-09 DIAGNOSIS — Z91040 Latex allergy status: Secondary | ICD-10-CM | POA: Insufficient documentation

## 2012-10-09 DIAGNOSIS — Y93E5 Activity, floor mopping and cleaning: Secondary | ICD-10-CM | POA: Insufficient documentation

## 2012-10-09 DIAGNOSIS — Z8709 Personal history of other diseases of the respiratory system: Secondary | ICD-10-CM | POA: Insufficient documentation

## 2012-10-09 DIAGNOSIS — S61209A Unspecified open wound of unspecified finger without damage to nail, initial encounter: Secondary | ICD-10-CM | POA: Insufficient documentation

## 2012-10-09 DIAGNOSIS — Y990 Civilian activity done for income or pay: Secondary | ICD-10-CM | POA: Insufficient documentation

## 2012-10-09 DIAGNOSIS — Z880 Allergy status to penicillin: Secondary | ICD-10-CM | POA: Insufficient documentation

## 2012-10-09 DIAGNOSIS — Z791 Long term (current) use of non-steroidal anti-inflammatories (NSAID): Secondary | ICD-10-CM | POA: Insufficient documentation

## 2012-10-09 DIAGNOSIS — W268XXA Contact with other sharp object(s), not elsewhere classified, initial encounter: Secondary | ICD-10-CM | POA: Insufficient documentation

## 2012-10-09 DIAGNOSIS — Z862 Personal history of diseases of the blood and blood-forming organs and certain disorders involving the immune mechanism: Secondary | ICD-10-CM | POA: Insufficient documentation

## 2012-10-09 DIAGNOSIS — S61219A Laceration without foreign body of unspecified finger without damage to nail, initial encounter: Secondary | ICD-10-CM

## 2012-10-09 NOTE — ED Notes (Signed)
Pt reports she was cleaning a piece of equipment at work and incidentally cut her right second digit, bleeding controlled at present. Pt A&Ox4 in no acute distress.

## 2012-10-09 NOTE — ED Provider Notes (Signed)
CSN: [id number]     Arrival date & time 10/09/12  2215 History  This chart was scribed for non-physician practitioner working with Dagmar Hait, MD by Ronal Fear, ED scribe. This patient was seen in room WTR5/WTR5 and the patient's care was started at 10:37 PM.    Chief Complaint  Patient presents with  . Extremity Laceration    The history is provided by the patient. No language interpreter was used.    HPI Comments: Sierra Shields is a 62 y.o. female who presents to the Emergency Department complaining of finger laceration.  She cut her right index finger on a stainless steel object while cleaning a table at work.  Her last tetanus shot was 5 years ago.    Past Medical History  Diagnosis Date  . Hypertension   . Anemia   . GERD (gastroesophageal reflux disease)   . Allergic rhinitis    Past Surgical History  Procedure Laterality Date  . Appendectomy    . Cholecystectomy    . Total abdominal hysterectomy  06/2001   Family History  Problem Relation Age of Onset  . Diabetes Mother   . Hypertension Mother   . Diabetes Father   . Hypertension    . Heart failure Sister    History  Substance Use Topics  . Smoking status: Never Smoker   . Smokeless tobacco: Not on file  . Alcohol Use: No     Comment: special occasions-rare   OB History   Grav Para Term Preterm Abortions TAB SAB Ect Mult Living                 Review of Systems  Skin: Positive for wound.  All other systems reviewed and are negative.    Allergies  Ampicillin; Penicillins; and Latex  Home Medications   Current Outpatient Rx  Name  Route  Sig  Dispense  Refill  . amLODipine (NORVASC) 5 MG tablet   Oral   Take 1 tablet (5 mg total) by mouth daily.   90 tablet   3   . aspirin 81 MG tablet   Oral   Take 81 mg by mouth daily.         Marland Kitchen BLACK COHOSH PO   Oral   Take by mouth daily.         Marland Kitchen HYDROcodone-acetaminophen (NORCO/VICODIN) 5-325 MG per tablet   Oral   Take 1 tablet  by mouth every 6 (six) hours as needed for pain.   20 tablet   0   . lisinopril-hydrochlorothiazide (PRINZIDE,ZESTORETIC) 20-12.5 MG per tablet   Oral   Take 1 tablet by mouth daily.   90 tablet   3   . metoprolol (LOPRESSOR) 50 MG tablet   Oral   Take 1 tablet (50 mg total) by mouth 2 (two) times daily.   180 tablet   3   . Misc Natural Products (GLUCOSAMINE CHOND COMPLEX/MSM PO)   Oral   Take by mouth.         . nabumetone (RELAFEN) 750 MG tablet      TAKE 1 TABLET TWICE A DAY   180 tablet   1   . omeprazole (PRILOSEC) 20 MG capsule   Oral   Take 1 capsule (20 mg total) by mouth daily.   90 capsule   2   . potassium chloride (KLOR-CON 10) 10 MEQ tablet   Oral   Take 1 tablet (10 mEq total) by mouth daily.   90 tablet  2   . traMADol (ULTRAM) 50 MG tablet   Oral   Take 1 tablet (50 mg total) by mouth 2 (two) times daily as needed for pain.   180 tablet   1    Triage Vital: BP 142/75  Pulse 76  Temp(Src) 98.1 F (36.7 C) (Oral)  SpO2 98% Physical Exam  Nursing note and vitals reviewed. Constitutional: She is oriented to person, place, and time. She appears well-developed and well-nourished. No distress.  HENT:  Head: Normocephalic and atraumatic.  Eyes: EOM are normal.  Neck: Normal range of motion.  Cardiovascular: Normal rate.   Pulmonary/Chest: Effort normal.  Musculoskeletal: Normal range of motion.  Neurological: She is alert and oriented to person, place, and time.  Skin: Skin is warm and dry.  Tissue avulsion at medial aspect of right index finger, involves cuticle, less than .25 CM  Psychiatric: She has a normal mood and affect. Her behavior is normal.    ED Course  Procedures (including critical care time)  DIAGNOSTIC STUDIES: Oxygen Saturation is 98% on room, normal by my interpretation.    COORDINATION OF CARE: 10:40 PM- Pt advised of plan for treatment including dermabonding the laceration and pt agrees.     Labs  Review Labs Reviewed - No data to display Imaging Review No results found.  MDM  No diagnosis found.  Due to the superficial nature of this small avulsion the area was Dermabond it not requiring sutures  I personally performed the services described in this documentation, which was scribed in my presence. The recorded information has been reviewed and is accurate.    Arman Filter, NP 10/09/12 2306

## 2012-10-10 NOTE — ED Notes (Signed)
Pt ambulating independently w/ steady gait on d/c in no acute distress, A&Ox4.D/c instructions reviewed w/ pt and family - pt and family deny any further questions or concerns at present.  

## 2012-10-10 NOTE — ED Provider Notes (Signed)
Medical screening examination/treatment/procedure(s) were performed by non-physician practitioner and as supervising physician I was immediately available for consultation/collaboration.   William Idalia Allbritton, MD 10/10/12 0017 

## 2013-03-05 ENCOUNTER — Other Ambulatory Visit: Payer: Self-pay | Admitting: Family Medicine

## 2013-04-14 ENCOUNTER — Ambulatory Visit (INDEPENDENT_AMBULATORY_CARE_PROVIDER_SITE_OTHER): Payer: BC Managed Care – PPO | Admitting: Family Medicine

## 2013-04-14 ENCOUNTER — Encounter: Payer: Self-pay | Admitting: Family Medicine

## 2013-04-14 VITALS — BP 124/76 | HR 69 | Temp 98.1°F | Resp 16 | Ht 66.0 in | Wt 275.0 lb

## 2013-04-14 DIAGNOSIS — M1710 Unilateral primary osteoarthritis, unspecified knee: Secondary | ICD-10-CM

## 2013-04-14 DIAGNOSIS — I10 Essential (primary) hypertension: Secondary | ICD-10-CM

## 2013-04-14 DIAGNOSIS — Z5181 Encounter for therapeutic drug level monitoring: Secondary | ICD-10-CM

## 2013-04-14 DIAGNOSIS — E669 Obesity, unspecified: Secondary | ICD-10-CM

## 2013-04-14 DIAGNOSIS — M25569 Pain in unspecified knee: Secondary | ICD-10-CM

## 2013-04-14 DIAGNOSIS — M179 Osteoarthritis of knee, unspecified: Secondary | ICD-10-CM

## 2013-04-14 DIAGNOSIS — IMO0002 Reserved for concepts with insufficient information to code with codable children: Secondary | ICD-10-CM

## 2013-04-14 LAB — COMPREHENSIVE METABOLIC PANEL
ALBUMIN: 4.1 g/dL (ref 3.5–5.2)
ALK PHOS: 69 U/L (ref 39–117)
ALT: 16 U/L (ref 0–35)
AST: 14 U/L (ref 0–37)
BUN: 13 mg/dL (ref 6–23)
CALCIUM: 9.3 mg/dL (ref 8.4–10.5)
CHLORIDE: 101 meq/L (ref 96–112)
CO2: 28 mEq/L (ref 19–32)
CREATININE: 0.61 mg/dL (ref 0.50–1.10)
GLUCOSE: 97 mg/dL (ref 70–99)
POTASSIUM: 3.7 meq/L (ref 3.5–5.3)
Sodium: 138 mEq/L (ref 135–145)
Total Bilirubin: 1 mg/dL (ref 0.2–1.2)
Total Protein: 7.1 g/dL (ref 6.0–8.3)

## 2013-04-14 LAB — LIPID PANEL
Cholesterol: 156 mg/dL (ref 0–200)
HDL: 49 mg/dL (ref >39)
LDL Cholesterol: 81 mg/dL (ref 0–99)
TRIGLYCERIDES: 129 mg/dL (ref <150)
Total CHOL/HDL Ratio: 3.2 Ratio
VLDL: 26 mg/dL (ref 0–40)

## 2013-04-14 LAB — CBC
HEMATOCRIT: 38.1 % (ref 36.0–46.0)
HEMOGLOBIN: 13.1 g/dL (ref 12.0–15.0)
MCH: 31 pg (ref 26.0–34.0)
MCHC: 34.4 g/dL (ref 30.0–36.0)
MCV: 90.3 fL (ref 78.0–100.0)
Platelets: 324 10*3/uL (ref 150–400)
RBC: 4.22 MIL/uL (ref 3.87–5.11)
RDW: 14 % (ref 11.5–15.5)
WBC: 9.8 10*3/uL (ref 4.0–10.5)

## 2013-04-14 MED ORDER — NABUMETONE 750 MG PO TABS: ORAL_TABLET | ORAL | Status: DC

## 2013-04-14 MED ORDER — HYDROCODONE-ACETAMINOPHEN 5-325 MG PO TABS: 1.0000 | ORAL_TABLET | Freq: Three times a day (TID) | ORAL | Status: DC | PRN

## 2013-04-14 NOTE — Patient Instructions (Signed)
Use the hydrocodone as needed for more severe pain- remember it can make you feel drowsy!   Continue to use the relafen as needed- once or twice a day. I will be in touch with your labs

## 2013-04-14 NOTE — Progress Notes (Signed)
[facility] [address] [place] [address] [phone]  Date:  04/14/2013   Name:  Sierra Shields   DOB:  22-Feb-1950   MRN:  [id number]  PCP:  Ellsworth Lennox, MD    Chief Complaint: rx refills   History of Present Illness:  Sierra Shields is a 63 y.o. very pleasant female patient who presents with the following:  She is here today to recheck her knee pain.  She is now going to [facility] and is getting some injections into her knees- she is not sure if it is supartz, steroids, etc.  This does seem to be helping "a lot."  She plans to have knee replacement surgery in the next year or so.   We had given her tramadol, but she does not feel it is strong enough and does not help with her pain.   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  She does an 8 hour shift and may take something for pain once.  relafen is enough for her on non- working days At this point she is not taking anything OTC.  She has used some hydrocodone per Dr. Rip Harbour; this did help but she worries that it may be addictive.   She also uses relafen- has used this for some time. Generally takes it once a day.  If she does not take it her pain is worse.   She is fasting today  Patient Active Problem List   Diagnosis Date Noted  . Obesity, Class III, BMI 40-49.9 (morbid obesity) 04/04/2011  . Left knee DJD 04/04/2011  . Right knee DJD 04/04/2011  . Hypertension   . Anemia   . GERD (gastroesophageal reflux disease)   . Allergic rhinitis     Past Medical History  Diagnosis Date  . Hypertension   . Anemia   . GERD (gastroesophageal reflux disease)   . Allergic rhinitis     Past Surgical History  Procedure Laterality Date  . Appendectomy    . Cholecystectomy    . Total abdominal hysterectomy  06/2001    History  Substance Use Topics  . Smoking status: Never Smoker   . Smokeless tobacco: Not on file  . Alcohol Use:  No     Comment: special occasions-rare    Family History  Problem Relation Age of Onset  . Diabetes Mother   . Hypertension Mother   . Diabetes Father   . Hypertension    . Heart failure Sister     Allergies  Allergen Reactions  . Ampicillin Other (See Comments)    Causes blisters  . Penicillins Hives  . Latex Rash    Medication list has been reviewed and updated.  Current Outpatient Prescriptions on File Prior to Visit  Medication Sig Dispense Refill  . amLODipine (NORVASC) 5 MG tablet Take 5 mg by mouth every morning.      Marland Kitchen aspirin 81 MG tablet Take 81 mg by mouth every morning.       Marland Kitchen glucosamine-chondroitin 500-400 MG tablet Take 2 tablets by mouth every morning.      Marland Kitchen lisinopril-hydrochlorothiazide (PRINZIDE,ZESTORETIC) 20-12.5 MG per tablet Take 1 tablet by mouth every morning.      . metoprolol (LOPRESSOR) 50 MG tablet Take 100 mg by mouth every morning.      . nabumetone (RELAFEN) 750 MG tablet Take 1 tablet (750 mg total) by mouth 2 (two) times daily. PATIENT  NEEDS OFFICE VISIT FOR ADDITIONAL REFILLS  60 tablet  0  . omeprazole (PRILOSEC) 20 MG capsule Take 20 mg by mouth every morning.      . potassium chloride (K-DUR) 10 MEQ tablet Take 10 mEq by mouth every morning.      . traMADol (ULTRAM) 50 MG tablet Take 1 tablet (50 mg total) by mouth 2 (two) times daily as needed for pain.  180 tablet  1   No current facility-administered medications on file prior to visit.    Review of Systems:  As per HPI- otherwise negative.   Physical Examination: Filed Vitals:   04/14/13 0941  BP: 124/76  Pulse: 69  Temp: 98.1 F (36.7 C)  Resp: 16   Filed Vitals:   04/14/13 0941  Height: 5\' 6"  (1.676 m)  Weight: 275 lb (124.739 kg)   Body mass index is 44.41 kg/(m^2). Ideal Body Weight: Weight in (lb) to have BMI = 25: 154.6  GEN: WDWN, NAD, Non-toxic, A & O x 3, obese, looks well HEENT: Atraumatic, Normocephalic. Neck supple. No masses, No LAD. Ears and Nose:  No external deformity. CV: RRR, No M/G/R. No JVD. No thrill. No extra heart sounds. PULM: CTA B, no wheezes, crackles, rhonchi. No retractions. No resp. distress. No accessory muscle use. EXTR: No c/c/e NEURO Normal gait.  PSYCH: Normally interactive. Conversant. Not depressed or anxious appearing.  Calm demeanor.  Knees: crepitus in bilateral knees, no effusion noted.  Full extension.    Assessment and Plan: Knee pain - Plan: nabumetone (RELAFEN) 750 MG tablet, HYDROcodone-acetaminophen (NORCO/VICODIN) 5-325 MG per tablet  Degenerative joint disease of knee  Obesity, unspecified - Plan: Lipid panel  HTN (hypertension) - Plan: Lipid panel  Medication monitoring encounter - Plan: CBC, Comprehensive metabolic panel  Discussed medications with her in detail.  Although it is true that hydrocodone can be habit forming, she may need to take it if her knee pain is intolerable.  She will use this with caution, as little as possible Also refilled her relafen for PRN use Check labs for her today  Signed Lamar Blinks, MD  Use

## 2013-04-20 ENCOUNTER — Telehealth: Payer: Self-pay

## 2013-04-20 DIAGNOSIS — M25569 Pain in unspecified knee: Secondary | ICD-10-CM

## 2013-04-20 NOTE — Telephone Encounter (Signed)
Patient needs 90 day supply.    [facility] [address]

## 2013-04-22 MED ORDER — NABUMETONE 750 MG PO TABS: ORAL_TABLET | ORAL | Status: DC

## 2013-04-22 NOTE — Telephone Encounter (Signed)
Pt stated that she just p/up the 30 days of nabumatone but pharm stated ins prefers 90 days. I will resend as 90 day supplies to put on hold. Advised pt to have pharm put in "comments" on RF reqs of other meds need 90 day supplies as RFs are needed. Pt agreed.

## 2013-08-24 ENCOUNTER — Other Ambulatory Visit: Payer: Self-pay | Admitting: Family Medicine

## 2013-08-24 DIAGNOSIS — I10 Essential (primary) hypertension: Secondary | ICD-10-CM

## 2013-08-24 DIAGNOSIS — K219 Gastro-esophageal reflux disease without esophagitis: Secondary | ICD-10-CM

## 2013-10-24 ENCOUNTER — Other Ambulatory Visit: Payer: Self-pay | Admitting: Family Medicine

## 2013-11-24 ENCOUNTER — Ambulatory Visit (INDEPENDENT_AMBULATORY_CARE_PROVIDER_SITE_OTHER): Payer: BC Managed Care – PPO | Admitting: Family Medicine

## 2013-11-24 ENCOUNTER — Encounter: Payer: Self-pay | Admitting: Family Medicine

## 2013-11-24 VITALS — BP 128/90 | HR 73 | Temp 98.5°F | Resp 18 | Wt 284.0 lb

## 2013-11-24 DIAGNOSIS — I10 Essential (primary) hypertension: Secondary | ICD-10-CM

## 2013-11-24 DIAGNOSIS — E669 Obesity, unspecified: Secondary | ICD-10-CM

## 2013-11-24 DIAGNOSIS — Z1211 Encounter for screening for malignant neoplasm of colon: Secondary | ICD-10-CM

## 2013-11-24 DIAGNOSIS — Z23 Encounter for immunization: Secondary | ICD-10-CM

## 2013-11-24 LAB — BASIC METABOLIC PANEL
BUN: 15 mg/dL (ref 6–23)
CO2: 30 mEq/L (ref 19–32)
Calcium: 9.6 mg/dL (ref 8.4–10.5)
Chloride: 102 mEq/L (ref 96–112)
Creat: 0.7 mg/dL (ref 0.50–1.10)
GLUCOSE: 92 mg/dL (ref 70–99)
Potassium: 3.6 mEq/L (ref 3.5–5.3)
Sodium: 141 mEq/L (ref 135–145)

## 2013-11-24 MED ORDER — POTASSIUM CHLORIDE CRYS ER 10 MEQ PO TBCR: 10.0000 meq | EXTENDED_RELEASE_TABLET | Freq: Every day | ORAL | Status: DC

## 2013-11-24 MED ORDER — LISINOPRIL-HYDROCHLOROTHIAZIDE 20-12.5 MG PO TABS: 1.0000 | ORAL_TABLET | Freq: Every morning | ORAL | Status: DC

## 2013-11-24 NOTE — Patient Instructions (Signed)
I will be in touch with your labs   Your blood pressure looks ok today. Continue your same medication regimen It looks like your last colonoscopy was done by Dr. Deatra Ina with [facility] in 2004- you are overdue for an update, please give them a call [facility]: Erskine Emery D MD  Internist  Address: [address], [place], [place] [address]  [phone]  Please come and see [place] in about 6 months. You can also get a shingles vaccine at most major drug stores at your convenience

## 2013-11-24 NOTE — Progress Notes (Signed)
[facility] [address] [place] [address] [phone]  Date:  11/24/2013   Name:  Sierra Shields   DOB:  [birth year]   MRN:  [id number]  PCP:  Ellsworth Lennox, MD    Chief Complaint: Hypertension   History of Present Illness:  Sierra Shields is a 63 y.o. very pleasant female patient who presents with the following:  Here today for medication follow-up.  Last labs about 6 months ago.  She is overall doing well.   Would like a flu shot today Does not generally check her BP at home.  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   She notes that it is harder for her to control her weight now than it was in the past.  Wonders if anything can be done to help with this.  She might be interested in weight loss surgery.    Last colonoscopy was in 2004 Patient Active Problem List   Diagnosis Date Noted  . Obesity, Class III, BMI 40-49.9 (morbid obesity) 04/04/2011  . Left knee DJD 04/04/2011  . Right knee DJD 04/04/2011  . Hypertension   . Anemia   . GERD (gastroesophageal reflux disease)   . Allergic rhinitis     Past Medical History  Diagnosis Date  . Hypertension   . Anemia   . GERD (gastroesophageal reflux disease)   . Allergic rhinitis     Past Surgical History  Procedure Laterality Date  . Appendectomy    . Cholecystectomy    . Total abdominal hysterectomy  06/2001    History  Substance Use Topics  . Smoking status: Never Smoker   . Smokeless tobacco: Not on file  . Alcohol Use: No     Comment: special occasions-rare    Family History  Problem Relation Age of Onset  . Diabetes Mother   . Hypertension Mother   . Diabetes Father   . Hypertension    . Heart failure Sister     Allergies  Allergen Reactions  . Ampicillin Other (See Comments)    Causes  blisters  . Penicillins Hives  . Latex Rash    Medication list has been reviewed and updated.  Current Outpatient Prescriptions on File Prior to Visit  Medication Sig Dispense Refill  . amLODipine (NORVASC) 5 MG tablet Take 5 mg by mouth every morning.      Marland Kitchen aspirin 81 MG tablet Take 81 mg by mouth every morning.       Marland Kitchen glucosamine-chondroitin 500-400 MG tablet Take 2 tablets by mouth every morning.      Marland Kitchen HYDROcodone-acetaminophen (NORCO/VICODIN) 5-325 MG per tablet Take 1 tablet by mouth every 8 (eight) hours as needed for severe pain.  30 tablet  0  . lisinopril-hydrochlorothiazide (PRINZIDE,ZESTORETIC) 20-12.5 MG per tablet Take 1 tablet by mouth every morning.      . metoprolol (LOPRESSOR) 50 MG tablet Take 100 mg by mouth every morning.      . metoprolol (LOPRESSOR) 50 MG tablet TAKE ONE TABLET BY MOUTH TWICE DAILY  180 tablet  0  . nabumetone (RELAFEN) 750 MG tablet Take 1 twice a day as needed for knee pain  180 tablet  0  . omeprazole (PRILOSEC) 20 MG capsule Take 20 mg by mouth every morning.      Marland Kitchen  potassium chloride (K-DUR) 10 MEQ tablet Take 10 mEq by mouth every morning.      . potassium chloride (KLOR-CON M10) 10 MEQ tablet Take 1 tablet (10 mEq total) by mouth daily. PATIENT NEEDS OFFICE VISIT FOR ADDITIONAL REFILLS  30 tablet  0   No current facility-administered medications on file prior to visit.    Review of Systems:  As per HPI- otherwise negative.   Physical Examination: Filed Vitals:   11/24/13 0900  BP: 140/96  Pulse: 73  Temp: 98.5 F (36.9 C)  Resp: 18   Filed Vitals:   11/24/13 0900  Weight: 284 lb (128.822 kg)   Body mass index is 45.86 kg/(m^2). Ideal Body Weight:    GEN: WDWN, NAD, Non-toxic, A & O x 3, obese, looks well HEENT: Atraumatic, Normocephalic. Neck supple. No masses, No LAD. Ears and Nose: No external deformity. CV: RRR, No M/G/R. No JVD. No thrill. No extra heart sounds. PULM: CTA B, no wheezes, crackles, rhonchi. No  retractions. No resp. distress. No accessory muscle use. EXTR: No c/c/e NEURO Normal gait.  PSYCH: Normally interactive. Conversant. Not depressed or anxious appearing.  Calm demeanor.    Assessment and Plan: Essential hypertension - Plan: lisinopril-hydrochlorothiazide (PRINZIDE,ZESTORETIC) 20-12.5 MG per tablet, potassium chloride (KLOR-CON M10) 10 MEQ tablet, Basic metabolic panel  Need for prophylactic vaccination with Streptococcus pneumoniae (Pneumococcus) and Influenza vaccines - Plan: Flu Vaccine QUAD 36+ mos IM  Colon cancer screening  Immunization due  Obesity  BP is ok, continue medications.  She will contact her GI doctor to schedule a screening colonoscopy She will look into the bariatric program through [facility] to get more information.    Signed Lamar Blinks, MD  Results for orders placed in visit on 73/71/06  BASIC METABOLIC PANEL      Result Value Ref Range   Sodium 141  135 - 145 mEq/L   Potassium 3.6  3.5 - 5.3 mEq/L   Chloride 102  96 - 112 mEq/L   CO2 30  19 - 32 mEq/L   Glucose, Bld 92  70 - 99 mg/dL   BUN 15  6 - 23 mg/dL   Creat 0.70  0.50 - 1.10 mg/dL   Calcium 9.6  8.4 - 10.5 mg/dL

## 2013-11-29 ENCOUNTER — Other Ambulatory Visit: Payer: Self-pay | Admitting: Family Medicine

## 2013-11-29 ENCOUNTER — Telehealth: Payer: Self-pay | Admitting: Family Medicine

## 2013-11-29 NOTE — Telephone Encounter (Signed)
Patient called, states she needs a refill on ALL of her medicines (which she could not provide the names of). Please send refills to [facility] on [address]   [phone]

## 2013-11-30 NOTE — Telephone Encounter (Signed)
Refills have been sent. Pt has been advised.

## 2013-12-16 ENCOUNTER — Other Ambulatory Visit: Payer: Self-pay | Admitting: Family Medicine

## 2013-12-16 DIAGNOSIS — M25561 Pain in right knee: Secondary | ICD-10-CM

## 2013-12-16 DIAGNOSIS — M25562 Pain in left knee: Principal | ICD-10-CM

## 2013-12-17 NOTE — Telephone Encounter (Signed)
Patient requesting a refill on "Nabumetone 750 mg". Per last refill patient needed an office visit before anymore refills. Patient did see Dr. Lorelei Pont on 11/24/13. Please call in to [facility] on [address]. Patients call back number is [phone]

## 2013-12-17 NOTE — Telephone Encounter (Signed)
Dr Lorelei Pont, you saw pt in Oct but not for this. Do you want to RF?

## 2014-01-07 ENCOUNTER — Ambulatory Visit (INDEPENDENT_AMBULATORY_CARE_PROVIDER_SITE_OTHER): Payer: BC Managed Care – PPO | Admitting: Family Medicine

## 2014-01-07 VITALS — BP 138/98 | HR 76 | Temp 97.9°F | Resp 16 | Ht 66.25 in | Wt 294.0 lb

## 2014-01-07 DIAGNOSIS — M25561 Pain in right knee: Secondary | ICD-10-CM

## 2014-01-07 DIAGNOSIS — I10 Essential (primary) hypertension: Secondary | ICD-10-CM

## 2014-01-07 DIAGNOSIS — M25562 Pain in left knee: Secondary | ICD-10-CM

## 2014-01-07 MED ORDER — HYDROCODONE-ACETAMINOPHEN 5-325 MG PO TABS: 1.0000 | ORAL_TABLET | Freq: Three times a day (TID) | ORAL | Status: DC | PRN

## 2014-01-07 NOTE — Progress Notes (Signed)
Subjective:    Patient ID: Sierra Shields, female    DOB: 25-Sep-1950, 63 y.o.   MRN: [id number] This chart was scribed for Dr. Merri Ray by Steva Colder, ED Scribe. The patient was seen in room 2 at 10:20 AM.   Chief Complaint  Patient presents with  . Discuss Medication    Arthritis Med   Glennie Hawk, MD   HPI Sierra Shields is a 63 y.o. female with a medical hx of DJD of knees, who presents today complaining of arthritis. She goes to Dr. Rip Harbour at [facility] where she is getting article cartilage injections and steroid injections. She is maxed our for the year and cant get another injection until January. The injections aided in relief for her DJD for 3.5 months at a time. She reports that the lubricant didn't work as well. She has a funeral to attend and needs something to get her by.   She states that she has tried Tramadol with no relief for her symptoms. She denies rashes, weakness, blood in stool, HA,  injury, and any other associated symptoms. She denies any new activity. She takes the Nabumetone but she was informed by her doctor that he is worried about giving her pain because of her HTN. She was informed that she needs knee replacements on both knees but she does not want the surgery yet.   Pt refilled Relafen for PRN use and hydrocodone for PRN use and was given number 30.  Last seen here with Dr. Edilia Bo on 11/29/13 for a medication refill.    Patient Active Problem List   Diagnosis Date Noted  . Obesity, Class III, BMI 40-49.9 (morbid obesity) 04/04/2011  . Left knee DJD 04/04/2011  . Right knee DJD 04/04/2011  . Hypertension   . Anemia   . GERD (gastroesophageal reflux disease)   . Allergic rhinitis    Past Medical History  Diagnosis Date  . Hypertension   . Anemia   . GERD (gastroesophageal reflux disease)   . Allergic rhinitis    Past Surgical History  Procedure Laterality Date  . Appendectomy    . Cholecystectomy    .  Total abdominal hysterectomy  06/2001   Allergies  Allergen Reactions  . Ampicillin Other (See Comments)    Causes blisters  . Penicillins Hives  . Latex Rash   Prior to Admission medications   Medication Sig Start Date End Date Taking? Authorizing Provider  amLODipine (NORVASC) 5 MG tablet TAKE ONE TABLET BY MOUTH ONCE DAILY 11/30/13  Yes Gay Filler Copland, MD  aspirin 81 MG tablet Take 81 mg by mouth every morning.    Yes Historical Provider, MD  glucosamine-chondroitin 500-400 MG tablet Take 2 tablets by mouth every morning.   Yes Historical Provider, MD  HYDROcodone-acetaminophen (NORCO/VICODIN) 5-325 MG per tablet Take 1 tablet by mouth every 8 (eight) hours as needed for severe pain. 04/14/13  Yes Gay Filler Copland, MD  lisinopril-hydrochlorothiazide (PRINZIDE,ZESTORETIC) 20-12.5 MG per tablet Take 1 tablet by mouth every morning. 11/24/13  Yes Gay Filler Copland, MD  metoprolol (LOPRESSOR) 50 MG tablet TAKE ONE TABLET BY MOUTH TWICE DAILY 11/30/13  Yes Gay Filler Copland, MD  nabumetone (RELAFEN) 750 MG tablet Take 1 twice a day as needed for knee pain 04/22/13  Yes Jessica C Copland, MD  nabumetone (RELAFEN) 750 MG tablet TAKE ONE TABLET BY MOUTH TWICE DAILY AS NEEDED. NEED OFFICE VISIT FOR ADDITIONAL REFILLS. 12/17/13  Yes Gay Filler Copland, MD  omeprazole (PRILOSEC) 20 MG  capsule TAKE ONE CAPSULE BY MOUTH ONCE DAILY 11/30/13  Yes Jessica C Copland, MD  potassium chloride (KLOR-CON M10) 10 MEQ tablet Take 1 tablet (10 mEq total) by mouth daily. 11/24/13  Yes Gay Filler Copland, MD  traMADol (ULTRAM) 50 MG tablet Take by mouth every 6 (six) hours as needed.   Yes Historical Provider, MD   History   Social History  . Marital Status: Single    Spouse Name: N/A    Number of Children: N/A  . Years of Education: N/A   Occupational History  . Not on file.   Social History Main Topics  . Smoking status: Never Smoker   . Smokeless tobacco: Not on file  . Alcohol Use: No     Comment:  special occasions-rare  . Drug Use: No  . Sexual Activity: Yes    Birth Control/ Protection: None   Other Topics Concern  . Not on file   Social History Narrative       Review of Systems  Constitutional: Negative for fatigue and unexpected weight change.  Respiratory: Negative for chest tightness and shortness of breath.   Cardiovascular: Negative for chest pain, palpitations and leg swelling.  Gastrointestinal: Negative for abdominal pain and blood in stool.  Musculoskeletal: Positive for arthralgias (DJD bilateral knees).  Skin: Negative for rash.  Neurological: Negative for dizziness, syncope, weakness, light-headedness and headaches.       Objective:   Physical Exam  Constitutional: She is oriented to person, place, and time. She appears well-developed and well-nourished.  HENT:  Head: Normocephalic and atraumatic.  Eyes: Conjunctivae and EOM are normal. Pupils are equal, round, and reactive to light.  Neck: Carotid bruit is not present.  Cardiovascular: Normal rate, regular rhythm, normal heart sounds and intact distal pulses.   Pulmonary/Chest: Effort normal and breath sounds normal.  Abdominal: Soft. She exhibits no pulsatile midline mass. There is no tenderness.  Musculoskeletal: She exhibits no edema.       Right knee: Tenderness found. Medial joint line and lateral joint line tenderness noted.       Left knee: Tenderness found. Medial joint line and lateral joint line tenderness noted.  Tender over the medial and lateral joint lines bilaterally worse on the left knee than right. Equal ROM of both knees. No significant lower leg edema. Sensation intact distally.   Neurological: She is alert and oriented to person, place, and time.  Skin: Skin is warm and dry.  Psychiatric: She has a normal mood and affect. Her behavior is normal.  Vitals reviewed.   Filed Vitals:   01/07/14 1001  BP: 138/98  Pulse: 76  Temp: 97.9 F (36.6 C)  TempSrc: Oral  Resp: 16    Height: 5' 6.25" (1.683 m)  Weight: 294 lb (133.358 kg)  SpO2: 96%     Assessment & Plan:  COORDINATION OF CARE: 10:27 AM-Discussed treatment plan which includes medication refill of hydrocodone with pt at bedside and pt agreed to plan.   Sierra Shields is a 63 y.o. female Arthralgia of both knees - Plan: HYDROcodone-acetaminophen (NORCO/VICODIN) 5-325 MG per tablet  Essential hypertension  bilateral knee OA, with intermittent flairs, without recent injury or change in activity. Prescribed hydrocodone for more painful episodes, but SED and risks of use discussed. Check home BP's as elevated diastolic today - may be pain component, but wih NSAID use if this remains elevated, may need to change regimen. rtc precautions.   Meds ordered this encounter  Medications  . traMADol (  ULTRAM) 50 MG tablet    Sig: Take by mouth every 6 (six) hours as needed.  Marland Kitchen HYDROcodone-acetaminophen (NORCO/VICODIN) 5-325 MG per tablet    Sig: Take 1 tablet by mouth every 8 (eight) hours as needed for severe pain.    Dispense:  30 tablet    Refill:  0   Patient Instructions  I can refill the stronger medicine for now as you are unable to obtain another injection until January. Follow up as scheduled with Dr. Rip Harbour, or if refill needed prior to that time - follow up with [place] -  Dr. Lorelei Pont or myself. Keep a record of your blood pressures outside of the office and if remaining over 140/90 - follow up to discuss your medications as NSAIDs can elevate blood pressure at times. Return to the clinic or go to the nearest emergency room if any of your symptoms worsen or new symptoms occur.        I personally performed the services described in this documentation, which was scribed in my presence. The recorded information has been reviewed and considered, and addended by me as needed.

## 2014-01-07 NOTE — Patient Instructions (Signed)
I can refill the stronger medicine for now as you are unable to obtain another injection until January. Follow up as scheduled with Dr. Rip Harbour, or if refill needed prior to that time - follow up with [place] -  Dr. Lorelei Pont or myself. Keep a record of your blood pressures outside of the office and if remaining over 140/90 - follow up to discuss your medications as NSAIDs can elevate blood pressure at times. Return to the clinic or go to the nearest emergency room if any of your symptoms worsen or new symptoms occur.

## 2014-02-23 ENCOUNTER — Other Ambulatory Visit: Payer: Self-pay | Admitting: Family Medicine

## 2014-02-23 DIAGNOSIS — K219 Gastro-esophageal reflux disease without esophagitis: Secondary | ICD-10-CM

## 2014-02-24 NOTE — Telephone Encounter (Signed)
Dr Lorelei Pont, you saw pt in Oct for check up but don't see this med addresses. OK to RF?

## 2014-06-06 ENCOUNTER — Other Ambulatory Visit: Payer: Self-pay | Admitting: Family Medicine

## 2014-06-13 ENCOUNTER — Other Ambulatory Visit: Payer: Self-pay | Admitting: Family Medicine

## 2014-07-09 ENCOUNTER — Other Ambulatory Visit: Payer: Self-pay | Admitting: Urgent Care

## 2014-07-18 ENCOUNTER — Telehealth: Payer: Self-pay | Admitting: *Deleted

## 2014-07-18 NOTE — Telephone Encounter (Signed)
Faxed request to Herbie Baltimore at [facility] stores theirs with them) requesting most recent mammo for patient.

## 2014-07-27 NOTE — Telephone Encounter (Signed)
Angelita Ingles stated that what we can see in Epic is the most up to date info they have as well [facility]).  Sending patient reminder letter.

## 2014-09-10 ENCOUNTER — Other Ambulatory Visit: Payer: Self-pay | Admitting: Physician Assistant

## 2014-09-15 ENCOUNTER — Other Ambulatory Visit: Payer: Self-pay | Admitting: Physician Assistant

## 2014-09-19 ENCOUNTER — Ambulatory Visit (INDEPENDENT_AMBULATORY_CARE_PROVIDER_SITE_OTHER): Payer: 59 | Admitting: Family Medicine

## 2014-09-19 ENCOUNTER — Encounter: Payer: Self-pay | Admitting: Family Medicine

## 2014-09-19 VITALS — BP 140/84 | HR 75 | Temp 98.6°F | Resp 16 | Ht 65.5 in | Wt 278.6 lb

## 2014-09-19 DIAGNOSIS — K219 Gastro-esophageal reflux disease without esophagitis: Secondary | ICD-10-CM | POA: Diagnosis not present

## 2014-09-19 DIAGNOSIS — Z13220 Encounter for screening for lipoid disorders: Secondary | ICD-10-CM | POA: Diagnosis not present

## 2014-09-19 DIAGNOSIS — M179 Osteoarthritis of knee, unspecified: Secondary | ICD-10-CM | POA: Diagnosis not present

## 2014-09-19 DIAGNOSIS — M1710 Unilateral primary osteoarthritis, unspecified knee: Secondary | ICD-10-CM

## 2014-09-19 DIAGNOSIS — Z119 Encounter for screening for infectious and parasitic diseases, unspecified: Secondary | ICD-10-CM

## 2014-09-19 DIAGNOSIS — Z130 Encounter for screening for diseases of the blood and blood-forming organs and certain disorders involving the immune mechanism: Secondary | ICD-10-CM | POA: Diagnosis not present

## 2014-09-19 DIAGNOSIS — I10 Essential (primary) hypertension: Secondary | ICD-10-CM | POA: Diagnosis not present

## 2014-09-19 DIAGNOSIS — Z0000 Encounter for general adult medical examination without abnormal findings: Secondary | ICD-10-CM

## 2014-09-19 DIAGNOSIS — Z131 Encounter for screening for diabetes mellitus: Secondary | ICD-10-CM

## 2014-09-19 DIAGNOSIS — M25571 Pain in right ankle and joints of right foot: Secondary | ICD-10-CM

## 2014-09-19 LAB — COMPREHENSIVE METABOLIC PANEL
ALT: 13 U/L (ref 6–29)
AST: 11 U/L (ref 10–35)
Albumin: 4.1 g/dL (ref 3.6–5.1)
Alkaline Phosphatase: 77 U/L (ref 33–130)
BILIRUBIN TOTAL: 0.8 mg/dL (ref 0.2–1.2)
BUN: 14 mg/dL (ref 7–25)
CO2: 29 mmol/L (ref 20–31)
Calcium: 9.5 mg/dL (ref 8.6–10.4)
Chloride: 100 mmol/L (ref 98–110)
Creat: 0.75 mg/dL (ref 0.50–0.99)
GLUCOSE: 81 mg/dL (ref 65–99)
Potassium: 3.5 mmol/L (ref 3.5–5.3)
Sodium: 142 mmol/L (ref 135–146)
Total Protein: 7.2 g/dL (ref 6.1–8.1)

## 2014-09-19 LAB — LIPID PANEL
Cholesterol: 160 mg/dL (ref 125–200)
HDL: 56 mg/dL (ref >=46)
LDL Cholesterol: 79 mg/dL (ref <130)
Total CHOL/HDL Ratio: 2.9 Ratio (ref <=5.0)
Triglycerides: 126 mg/dL (ref <150)
VLDL: 25 mg/dL (ref <30)

## 2014-09-19 LAB — CBC
HEMATOCRIT: 38.6 % (ref 36.0–46.0)
HEMOGLOBIN: 13.2 g/dL (ref 12.0–15.0)
MCH: 31.6 pg (ref 26.0–34.0)
MCHC: 34.2 g/dL (ref 30.0–36.0)
MCV: 92.3 fL (ref 78.0–100.0)
MPV: 10.3 fL (ref 8.6–12.4)
Platelets: 363 10*3/uL (ref 150–400)
RBC: 4.18 MIL/uL (ref 3.87–5.11)
RDW: 14.4 % (ref 11.5–15.5)
WBC: 9.9 10*3/uL (ref 4.0–10.5)

## 2014-09-19 MED ORDER — OMEPRAZOLE 20 MG PO CPDR: 20.0000 mg | DELAYED_RELEASE_CAPSULE | Freq: Every day | ORAL | Status: DC

## 2014-09-19 MED ORDER — METOPROLOL TARTRATE 50 MG PO TABS: ORAL_TABLET | ORAL | Status: DC

## 2014-09-19 MED ORDER — TRAMADOL HCL 50 MG PO TABS: 50.0000 mg | ORAL_TABLET | Freq: Four times a day (QID) | ORAL | Status: DC | PRN

## 2014-09-19 MED ORDER — NABUMETONE 750 MG PO TABS: 750.0000 mg | ORAL_TABLET | Freq: Two times a day (BID) | ORAL | Status: DC | PRN

## 2014-09-19 MED ORDER — AMLODIPINE BESYLATE 5 MG PO TABS
ORAL_TABLET | ORAL | Status: DC
Start: 2014-09-19 — End: 2015-02-23

## 2014-09-19 NOTE — Progress Notes (Signed)
[facility] [address] [place] [address] [phone]  Date:  09/19/2014   Name:  Sierra Shields   DOB:  08/27/1950   MRN:  [id number]  PCP:  Ellsworth Lennox, MD    Chief Complaint: Annual Exam   History of Present Illness:  Sierra Shields is a 64 y.o. very pleasant female patient who presents with the following:  Here today for a CPE She is fasting today for labs She has noted trouble with her right ankle as of late She recently saw Dr. Rip Harbour for her knee injections, then her right ankle seemed to flare up as well.  She is wearing a supportive wrap on the ankle.  NKI  Her right ankle has been painful for 3 days She does not have a known history of gout She needs 30 day supplies of her medications today She uses tramadol and relafen as needed for her joint pains, but she has been out of these for some time.    Patient Active Problem List   Diagnosis Date Noted  . Obesity, Class III, BMI 40-49.9 (morbid obesity) 04/04/2011  . Left knee DJD 04/04/2011  . Right knee DJD 04/04/2011  . Hypertension   . Anemia   . GERD (gastroesophageal reflux disease)   . Allergic rhinitis     Past Medical History  Diagnosis Date  . Hypertension   . Anemia   . GERD (gastroesophageal reflux disease)   . Allergic rhinitis     Past Surgical History  Procedure Laterality Date  . Appendectomy    . Cholecystectomy    . Total abdominal hysterectomy  06/2001    History  Substance Use Topics  . Smoking status: Never Smoker   . Smokeless tobacco: Not on file  . Alcohol Use: No     Comment: special occasions-rare    Family History  Problem Relation Age of Onset  . Diabetes Mother   . Hypertension Mother   . Diabetes Father   . Hypertension    . Heart failure Sister     Allergies  Allergen Reactions  . Ampicillin Other (See Comments)    Causes blisters  . Penicillins Hives  . Latex Rash    Medication list has been reviewed and  updated.  Current Outpatient Prescriptions on File Prior to Visit  Medication Sig Dispense Refill  . amLODipine (NORVASC) 5 MG tablet TAKE ONE TABLET BY MOUTH ONCE DAILY.  "OV NEEDED" 30 tablet 0  . aspirin 81 MG tablet Take 81 mg by mouth every morning.     Marland Kitchen glucosamine-chondroitin 500-400 MG tablet Take 2 tablets by mouth every morning.    Marland Kitchen HYDROcodone-acetaminophen (NORCO/VICODIN) 5-325 MG per tablet Take 1 tablet by mouth every 8 (eight) hours as needed for severe pain. 30 tablet 0  . lisinopril-hydrochlorothiazide (PRINZIDE,ZESTORETIC) 20-12.5 MG per tablet Take 1 tablet by mouth every morning. 90 tablet 3  . metoprolol (LOPRESSOR) 50 MG tablet TAKE ONE TABLET BY MOUTH TWICE DAILY.  "OV NEEDED" 60 tablet 0  . nabumetone (RELAFEN) 750 MG tablet Take 1 tablet (750 mg total) by mouth 2 (two) times daily as needed. NO MORE REFILLS WITHOUT OFFICE VISIT - 2ND NOTICE 30 tablet 0  . omeprazole (PRILOSEC) 20 MG capsule TAKE ONE CAPSULE BY MOUTH ONCE DAILY 90 capsule 0  . potassium chloride (KLOR-CON M10) 10 MEQ tablet Take 1 tablet (10 mEq total) by mouth daily. 90 tablet 3  . traMADol (ULTRAM) 50 MG tablet Take by mouth  every 6 (six) hours as needed.     No current facility-administered medications on file prior to visit.    Review of Systems:  As per HPI- otherwise negative.  Wt Readings from Last 3 Encounters:  09/19/14 278 lb 9.6 oz (126.372 kg)  01/07/14 294 lb (133.358 kg)  11/24/13 284 lb (128.822 kg)   BP Readings from Last 3 Encounters:  09/19/14 140/84  01/07/14 138/98  11/24/13 128/90      Physical Examination: Filed Vitals:   09/19/14 0844  BP: 140/84  Pulse: 75  Temp: 98.6 F (37 C)  Resp: 16   Filed Vitals:   09/19/14 0844  Height: 5' 5.5" (1.664 m)  Weight: 278 lb 9.6 oz (126.372 kg)   Body mass index is 45.64 kg/(m^2). Ideal Body Weight: Weight in (lb) to have BMI = 25: 152.2  GEN: WDWN, NAD, Non-toxic, A & O x 3, obese, looks well HEENT: Atraumatic,  Normocephalic. Neck supple. No masses, No LAD.  Bilateral TM wnl, oropharynx normal.  PEERL,EOMI.   Ears and Nose: No external deformity. CV: RRR, No M/G/R. No JVD. No thrill. No extra heart sounds. PULM: CTA B, no wheezes, crackles, rhonchi. No retractions. No resp. distress. No accessory muscle use. ABD: S, NT, ND, +BS. No rebound. No HSM. EXTR: No c/c/e NEURO Normal gait.  PSYCH: Normally interactive. Conversant. Not depressed or anxious appearing.  Calm demeanor.  Right ankle.  She is tender over the posterior heel at the achilles insertion.  Otherwise the ankle is normal and stable, no swelling, bruise or heat   Assessment and Plan: Physical exam  Screening for hyperlipidemia - Plan: Lipid panel  Essential hypertension - Plan: Comprehensive metabolic panel, metoprolol (LOPRESSOR) 50 MG tablet, amLODipine (NORVASC) 5 MG tablet  Screening examination for infectious disease - Plan: Hepatitis C antibody, HIV antibody  Screening for deficiency anemia - Plan: CBC  Gastroesophageal reflux disease without esophagitis - Plan: omeprazole (PRILOSEC) 20 MG capsule  Localized osteoarthritis of knee - Plan: traMADol (ULTRAM) 50 MG tablet, nabumetone (RELAFEN) 750 MG tablet  Right ankle pain - Plan: Ambulatory referral to Orthopedic Surgery  Screening for diabetes mellitus - Plan: Hemoglobin A1c  See patient instructions for more details.   Await labs   Signed Lamar Blinks, MD

## 2014-09-19 NOTE — Patient Instructions (Signed)
Great to see you today Use the tramadol as needed for severe pain, but remember it can make you a bit sleepy I refilled your relafen today- remember this is similar to ibuprofen (motrin)- use one or the other, not both You can get your shingles vaccine at your convenience at the drug store It looks like it has been 2 years since your last mammogram- schedule for this year if you can I will be in touch with your labs asap If your ankle does not feel better please follow-up with [facility]  Please call and schedule a mammogram.  There are several options in town, one good choice is:  [facility] ?  Address: [place], [place], [place] [address]  [phone]

## 2014-09-20 LAB — HEPATITIS C ANTIBODY: HCV Ab: NEGATIVE

## 2014-09-20 LAB — HEMOGLOBIN A1C
Hgb A1c MFr Bld: 5.8 % — ABNORMAL HIGH (ref <5.7)
Mean Plasma Glucose: 120 mg/dL — ABNORMAL HIGH (ref <117)

## 2014-09-20 LAB — HIV ANTIBODY (ROUTINE TESTING W REFLEX): HIV 1&2 Ab, 4th Generation: NONREACTIVE

## 2014-09-21 ENCOUNTER — Encounter: Payer: Self-pay | Admitting: Family Medicine

## 2014-11-05 ENCOUNTER — Ambulatory Visit (INDEPENDENT_AMBULATORY_CARE_PROVIDER_SITE_OTHER): Payer: 59 | Admitting: Emergency Medicine

## 2014-11-05 ENCOUNTER — Telehealth: Payer: Self-pay | Admitting: *Deleted

## 2014-11-05 ENCOUNTER — Other Ambulatory Visit: Payer: Self-pay | Admitting: *Deleted

## 2014-11-05 VITALS — BP 138/86 | HR 87 | Temp 98.5°F | Resp 18 | Ht 66.0 in | Wt 273.0 lb

## 2014-11-05 DIAGNOSIS — M10071 Idiopathic gout, right ankle and foot: Secondary | ICD-10-CM | POA: Diagnosis not present

## 2014-11-05 DIAGNOSIS — M109 Gout, unspecified: Secondary | ICD-10-CM

## 2014-11-05 MED ORDER — COLCHICINE 0.6 MG PO TABS: ORAL_TABLET | ORAL | Status: DC

## 2014-11-05 MED ORDER — COLCHICINE 0.6 MG PO CAPS: ORAL_CAPSULE | ORAL | Status: DC

## 2014-11-05 MED ORDER — INDOMETHACIN 25 MG PO CAPS: 25.0000 mg | ORAL_CAPSULE | Freq: Three times a day (TID) | ORAL | Status: DC

## 2014-11-05 NOTE — Progress Notes (Signed)
Subjective:  Patient ID: Sierra Shields, female    DOB: 11-13-1950  Age: 64 y.o. MRN: [id number]  CC: Foot Swelling   HPI Sierra Shields presents  she has a 3 day history of increasing pain and swelling and redness in her right great toe. She has no history of injury or overuse. No gout. No fever chills. Pain is very localized right to that joint.  History Sierra Shields has a past medical history of Hypertension; Anemia; GERD (gastroesophageal reflux disease); Allergic rhinitis; and Arthritis.   She has past surgical history that includes Appendectomy; Cholecystectomy; and Total abdominal hysterectomy (06/2001).   Her  family history includes Diabetes in her father and mother; Heart disease in her father, maternal grandmother, mother, and paternal grandfather; Heart failure in her sister; Hyperlipidemia in her father and maternal grandfather; Hypertension in her father, maternal grandmother, mother, and another family member; Stroke in her mother and paternal grandfather.  She   reports that she has never smoked. She does not have any smokeless tobacco history on file. She reports that she does not drink alcohol or use illicit drugs.  Outpatient Prescriptions Prior to Visit  Medication Sig Dispense Refill  . amLODipine (NORVASC) 5 MG tablet TAKE ONE TABLET BY MOUTH ONCE DAILY. 30 tablet 11  . aspirin 81 MG tablet Take 81 mg by mouth every morning.     Marland Kitchen BLACK COHOSH PO Take by mouth.    Marland Kitchen glucosamine-chondroitin 500-400 MG tablet Take 2 tablets by mouth every morning.    Marland Kitchen lisinopril-hydrochlorothiazide (PRINZIDE,ZESTORETIC) 20-12.5 MG per tablet Take 1 tablet by mouth every morning. 90 tablet 3  . metoprolol (LOPRESSOR) 50 MG tablet TAKE ONE TABLET BY MOUTH TWICE DAILY 60 tablet 11  . nabumetone (RELAFEN) 750 MG tablet Take 1 tablet (750 mg total) by mouth 2 (two) times daily as needed. Use as needed for joint pain 60 tablet 5  . omeprazole (PRILOSEC) 20 MG capsule Take 1 capsule (20 mg  total) by mouth daily. 30 capsule 11  . potassium chloride (KLOR-CON M10) 10 MEQ tablet Take 1 tablet (10 mEq total) by mouth daily. 90 tablet 3  . traMADol (ULTRAM) 50 MG tablet Take 1 tablet (50 mg total) by mouth every 6 (six) hours as needed. For joint pain 40 tablet 3   No facility-administered medications prior to visit.    Social History   Social History  . Marital Status: Single    Spouse Name: N/A  . Number of Children: N/A  . Years of Education: N/A   Occupational History  . Restaurant Mgr    Social History Main Topics  . Smoking status: Never Smoker   . Smokeless tobacco: None  . Alcohol Use: No     Comment: special occasions-rare  . Drug Use: No  . Sexual Activity: Yes    Birth Control/ Protection: None   Other Topics Concern  . None   Social History Narrative   Significant other. Education: [employer]. Exercise: No.     Review of Systems  Constitutional: Negative for fever, chills and appetite change.  HENT: Negative for congestion, ear pain, postnasal drip, sinus pressure and sore throat.   Eyes: Negative for pain and redness.  Respiratory: Negative for cough, shortness of breath and wheezing.   Cardiovascular: Negative for leg swelling.  Gastrointestinal: Negative for nausea, vomiting, abdominal pain, diarrhea, constipation and blood in stool.  Endocrine: Negative for polyuria.  Genitourinary: Negative for dysuria, urgency, frequency and flank pain.  Musculoskeletal: Positive for joint  swelling. Negative for gait problem.  Skin: Positive for color change. Negative for rash.  Neurological: Negative for weakness and headaches.  Psychiatric/Behavioral: Negative for confusion and decreased concentration. The patient is not nervous/anxious.     Objective:  BP 138/86 mmHg  Pulse 87  Temp(Src) 98.5 F (36.9 C)  Resp 18  Ht 5\' 6"  (1.676 m)  Wt 273 lb (123.832 kg)  BMI 44.08 kg/m2  SpO2 96%  Physical Exam  Constitutional: She is oriented to person,  place, and time. She appears well-developed and well-nourished.  HENT:  Head: Normocephalic and atraumatic.  Eyes: Conjunctivae are normal. Pupils are equal, round, and reactive to light.  Pulmonary/Chest: Effort normal.  Musculoskeletal: She exhibits no edema.  Neurological: She is alert and oriented to person, place, and time.  Skin: Skin is dry.  Psychiatric: She has a normal mood and affect. Her behavior is normal. Thought content normal.   She has an acutely swollen red right medical tarsal phalangeal joint of her great toe. It's exquisitely tender.    Assessment & Plan:   Nil was seen today for foot swelling.  Diagnoses and all orders for this visit:  Acute gout of right foot, unspecified cause  Other orders -     indomethacin (INDOCIN) 25 MG capsule; Take 1 capsule (25 mg total) by mouth 3 (three) times daily with meals. -     colchicine 0.6 MG tablet; 2 now and one in one hour.  One daily starting tomorrow   I am having Ms. Vayda start on indomethacin and colchicine. I am also having her maintain her aspirin, glucosamine-chondroitin, lisinopril-hydrochlorothiazide, potassium chloride, BLACK COHOSH PO, traMADol, omeprazole, nabumetone, metoprolol, and amLODipine.  Meds ordered this encounter  Medications  . indomethacin (INDOCIN) 25 MG capsule    Sig: Take 1 capsule (25 mg total) by mouth 3 (three) times daily with meals.    Dispense:  45 capsule    Refill:  0  . colchicine 0.6 MG tablet    Sig: 2 now and one in one hour.  One daily starting tomorrow    Dispense:  30 tablet    Refill:  0    Appropriate red flag conditions were discussed with the patient as well as actions that should be taken.  Patient expressed his understanding.  Follow-up: Return if symptoms worsen or fail to improve.  Sierra Culver, MD

## 2014-11-05 NOTE — Telephone Encounter (Signed)
Pt insurance will not pay for colchicine. Can we get her something else?  Can call pt back at [phone]

## 2014-11-05 NOTE — Patient Instructions (Signed)

## 2014-11-05 NOTE — Addendum Note (Signed)
Addended by: Kem Boroughs D on: 11/05/2014 11:03 AM   Modules accepted: Orders

## 2014-11-05 NOTE — Telephone Encounter (Signed)
no

## 2014-11-07 NOTE — Telephone Encounter (Signed)
Called pharm because if colchicine ( generic) is not covered, it is normally a case where the ins prefers either the brand Colcrys, OR brand name Mitigare caps. Tech checked on both generic and name Colcrys and when I asked her to try the [place], she stated that is what the response from ins specified. She can not change script on the phone. Called pt to advise and she stated that a new script was sent to [facility] on Sat and they were able to get it filled. I faxed back note to [facility] that they may DC it.

## 2014-12-05 ENCOUNTER — Ambulatory Visit (INDEPENDENT_AMBULATORY_CARE_PROVIDER_SITE_OTHER): Payer: 59 | Admitting: Family Medicine

## 2014-12-05 VITALS — BP 130/78 | HR 73 | Temp 98.5°F | Resp 20 | Ht 65.0 in | Wt 272.4 lb

## 2014-12-05 DIAGNOSIS — K2100 Gastro-esophageal reflux disease with esophagitis, without bleeding: Secondary | ICD-10-CM

## 2014-12-05 DIAGNOSIS — Z79899 Other long term (current) drug therapy: Secondary | ICD-10-CM | POA: Diagnosis not present

## 2014-12-05 DIAGNOSIS — M159 Polyosteoarthritis, unspecified: Secondary | ICD-10-CM

## 2014-12-05 DIAGNOSIS — G5602 Carpal tunnel syndrome, left upper limb: Secondary | ICD-10-CM

## 2014-12-05 DIAGNOSIS — M150 Primary generalized (osteo)arthritis: Secondary | ICD-10-CM | POA: Diagnosis not present

## 2014-12-05 DIAGNOSIS — I10 Essential (primary) hypertension: Secondary | ICD-10-CM

## 2014-12-05 DIAGNOSIS — R200 Anesthesia of skin: Secondary | ICD-10-CM

## 2014-12-05 DIAGNOSIS — K219 Gastro-esophageal reflux disease without esophagitis: Secondary | ICD-10-CM

## 2014-12-05 DIAGNOSIS — M79622 Pain in left upper arm: Secondary | ICD-10-CM

## 2014-12-05 DIAGNOSIS — R1013 Epigastric pain: Secondary | ICD-10-CM

## 2014-12-05 DIAGNOSIS — R208 Other disturbances of skin sensation: Secondary | ICD-10-CM

## 2014-12-05 LAB — POCT CBC
Granulocyte percent: 62.5 %G (ref 37–80)
HEMATOCRIT: 40.9 % (ref 37.7–47.9)
HEMOGLOBIN: 13.7 g/dL (ref 12.2–16.2)
Lymph, poc: 2.2 (ref 0.6–3.4)
MCH: 31.2 pg (ref 27–31.2)
MCHC: 33.6 g/dL (ref 31.8–35.4)
MCV: 92.9 fL (ref 80–97)
MID (cbc): 1 — AB (ref 0–0.9)
MPV: 7.6 fL (ref 0–99.8)
POC GRANULOCYTE: 5.3 (ref 2–6.9)
POC LYMPH %: 25.8 % (ref 10–50)
POC MID %: 11.7 %M (ref 0–12)
Platelet Count, POC: 251 10*3/uL (ref 142–424)
RBC: 4.4 M/uL (ref 4.04–5.48)
RDW, POC: 13.7 %
WBC: 8.5 10*3/uL (ref 4.6–10.2)

## 2014-12-05 LAB — COMPREHENSIVE METABOLIC PANEL
ALT: 16 U/L (ref 6–29)
AST: 14 U/L (ref 10–35)
Albumin: 4.4 g/dL (ref 3.6–5.1)
Alkaline Phosphatase: 78 U/L (ref 33–130)
BUN: 15 mg/dL (ref 7–25)
CHLORIDE: 102 mmol/L (ref 98–110)
CO2: 24 mmol/L (ref 20–31)
Calcium: 9.7 mg/dL (ref 8.6–10.4)
Creat: 0.79 mg/dL (ref 0.50–0.99)
Glucose, Bld: 116 mg/dL — ABNORMAL HIGH (ref 65–99)
POTASSIUM: 3.8 mmol/L (ref 3.5–5.3)
Sodium: 138 mmol/L (ref 135–146)
TOTAL PROTEIN: 7.4 g/dL (ref 6.1–8.1)
Total Bilirubin: 0.5 mg/dL (ref 0.2–1.2)

## 2014-12-05 LAB — POCT SEDIMENTATION RATE: POCT SED RATE: 26 mm/h — AB (ref 0–22)

## 2014-12-05 LAB — TROPONIN I

## 2014-12-05 MED ORDER — OMEPRAZOLE 40 MG PO CPDR: 40.0000 mg | DELAYED_RELEASE_CAPSULE | Freq: Every day | ORAL | Status: DC

## 2014-12-05 NOTE — Progress Notes (Signed)
Subjective:    Patient ID: Sierra Shields, female    DOB: Oct 14, 1950, 64 y.o.   MRN: [id number] Chief Complaint  Patient presents with  . Arm Pain    left arm numbness that started last night.  hurts from her sholder down into her hand and her arm feels cold  . Abdominal Pain    epigastric pain/hearburn that started last night.  denies any nausea/vomiting.    . Flu Vaccine    today--pt will call for her mammogram this week    HPI  Left arm began aching this morning.  She has CTS in wrist and she had an injection 3 yrs prior w/ no sxs since. No neck pain though she was having some mid back pain briefly prior.  Is having more aching in proximal arm and numbness in distal arm and hand the latter of which has persisted. Her duaghter has massaged the arm and she couldn't feel her touching her has well.  Is tried a tramadol which has helped some.  Has hot flashed so she doesn't know if that has change - still has .  Pt was seen by Dr. Wylene Simmer 2 mos prior for her annual exam.  She does have osteoarthritis and follows with Dr. Rip Harbour for knee injection, recently has had some right ankle pain as well. Uses tramadol and relafin as needed for joint pains.  Does have a H/o HTN and GERD. Is s/p cholecystectomy, non-smoker Taking omeprazole 20mg  qd - she stopped taking sev mos ago when instructed at her CPE which was fine until she developed recurrent abd pain and fatigue yesterday. She took a prilosec today and it didn't help sxs but did cause some more belching.  No GI/GU sxs.  No neck pain.  No f/c.  Sleeping fine.  Depression screen [facility] 2/9 12/05/2014 11/05/2014 09/19/2014  Decreased Interest 0 0 0  Down, Depressed, Hopeless 0 0 0  PHQ - 2 Score 0 0 0   Past Medical History  Diagnosis Date  . Hypertension   . Anemia   . GERD (gastroesophageal reflux disease)   . Allergic rhinitis   . Arthritis    Current Outpatient Prescriptions on File Prior to Visit  Medication Sig Dispense Refill    . amLODipine (NORVASC) 5 MG tablet TAKE ONE TABLET BY MOUTH ONCE DAILY. 30 tablet 11  . aspirin 81 MG tablet Take 81 mg by mouth every morning.     Marland Kitchen BLACK COHOSH PO Take by mouth.    . Colchicine 0.6 MG CAPS Take 2 capsules now and 1 capsule in one hour.  1 capsule daily starting tomorrow. 30 capsule 0  . glucosamine-chondroitin 500-400 MG tablet Take 2 tablets by mouth every morning.    . metoprolol (LOPRESSOR) 50 MG tablet TAKE ONE TABLET BY MOUTH TWICE DAILY 60 tablet 11  . nabumetone (RELAFEN) 750 MG tablet Take 1 tablet (750 mg total) by mouth 2 (two) times daily as needed. Use as needed for joint pain 60 tablet 5  . traMADol (ULTRAM) 50 MG tablet Take 1 tablet (50 mg total) by mouth every 6 (six) hours as needed. For joint pain 40 tablet 3   No current facility-administered medications on file prior to visit.   Allergies  Allergen Reactions  . Ampicillin Other (See Comments)    Causes blisters  . Penicillins Hives  . Latex Rash     Review of Systems  Constitutional: Positive for diaphoresis. Negative for fever, chills and activity change.  Gastrointestinal:  Positive for abdominal pain. Negative for nausea, vomiting, diarrhea and abdominal distention.  Genitourinary: Negative for dysuria and difficulty urinating.  Musculoskeletal: Positive for myalgias, joint swelling and arthralgias. Negative for gait problem, neck pain and neck stiffness.  Neurological: Positive for weakness and numbness.  Psychiatric/Behavioral: Negative for sleep disturbance and dysphoric mood.       Objective:  BP 130/78 mmHg  Pulse 73  Temp(Src) 98.5 F (36.9 C) (Oral)  Resp 20  Ht 5\' 5"  (1.651 m)  Wt 272 lb 6 oz (123.548 kg)  BMI 45.33 kg/m2  SpO2 97%  Physical Exam  Constitutional: She is oriented to person, place, and time. She appears well-developed and well-nourished. No distress.  HENT:  Head: Normocephalic and atraumatic.  Right Ear: External ear normal.  Left Ear: External ear  normal.  Eyes: Conjunctivae are normal. No scleral icterus.  Neck: Normal range of motion. Neck supple. No thyromegaly present.  Cardiovascular: Normal rate, regular rhythm, normal heart sounds and intact distal pulses.   Pulmonary/Chest: Effort normal and breath sounds normal. No respiratory distress.  Abdominal: Soft. Bowel sounds are normal. She exhibits no distension and no mass. There is no tenderness. There is no rebound and no guarding.  Musculoskeletal: She exhibits no edema.  Lymphadenopathy:    She has no cervical adenopathy.  Neurological: She is alert and oriented to person, place, and time.  Skin: Skin is warm and dry. She is not diaphoretic. No erythema.  Psychiatric: She has a normal mood and affect. Her behavior is normal.         [facility] reading (PRIMARY) by  Dr. Brigitte Pulse. EKG: Unchanged from prior, NSR - ST elevation lead 2 -3 but was at prior - j-point elevation in medial chest leads  Assessment & Plan:   1. Arm numbness left   2. Abdominal pain, epigastric   3. Left upper arm pain   4. Gastroesophageal reflux disease with esophagitis   5. Essential hypertension, benign   6. Primary osteoarthritis involving multiple joints   7. Carpal tunnel syndrome of left wrist   8. Polypharmacy   9. Gastroesophageal reflux disease without esophagitis     Orders Placed This Encounter  Procedures  . Comprehensive metabolic panel  . Troponin I    Please call to Brigitte Pulse at [phone]  . POCT CBC  . POCT SEDIMENTATION RATE  . EKG 12-Lead    Meds ordered this encounter  Medications  . omeprazole (PRILOSEC) 40 MG capsule    Sig: Take 1 capsule (40 mg total) by mouth daily. 30 minutes before dinner    Dispense:  30 capsule    Refill:  2     Delman Cheadle, MD MPH

## 2014-12-05 NOTE — Patient Instructions (Signed)
Food Choices for Gastroesophageal Reflux Disease, Adult When you have gastroesophageal reflux disease (GERD), the foods you eat and your eating habits are very important. Choosing the right foods can help ease the discomfort of GERD. WHAT GENERAL GUIDELINES DO I NEED TO FOLLOW?  Choose fruits, vegetables, whole grains, low-fat dairy products, and low-fat meat, fish, and poultry.  Limit fats such as oils, salad dressings, butter, nuts, and avocado.  Keep a food diary to identify foods that cause symptoms.  Avoid foods that cause reflux. These may be different for different people.  Eat frequent small meals instead of three large meals each day.  Eat your meals slowly, in a relaxed setting.  Limit fried foods.  Cook foods using methods other than frying.  Avoid drinking alcohol.  Avoid drinking large amounts of liquids with your meals.  Avoid bending over or lying down until 2-3 hours after eating. WHAT FOODS ARE NOT RECOMMENDED? The following are some foods and drinks that may worsen your symptoms: Vegetables Tomatoes. Tomato juice. Tomato and spaghetti sauce. Chili peppers. Onion and garlic. Horseradish. Fruits Oranges, grapefruit, and lemon (fruit and juice). Meats High-fat meats, fish, and poultry. This includes hot dogs, ribs, ham, sausage, salami, and bacon. Dairy Whole milk and chocolate milk. Sour cream. Cream. Butter. Ice cream. Cream cheese.  Beverages Coffee and tea, with or without caffeine. Carbonated beverages or energy drinks. Condiments Hot sauce. Barbecue sauce.  Sweets/Desserts Chocolate and cocoa. Donuts. Peppermint and spearmint. Fats and Oils High-fat foods, including [place] fries and potato chips. Other Vinegar. Strong spices, such as black pepper, white pepper, red pepper, cayenne, curry powder, cloves, ginger, and chili powder. The items listed above may not be a complete list of foods and beverages to avoid. Contact your dietitian for more  information.   This information is not intended to replace advice given to you by your health care provider. Make sure you discuss any questions you have with your health care provider.   Document Released: 01/28/2005 Document Revised: 02/18/2014 Document Reviewed: 12/02/2012 Elsevier Interactive Patient Education 2016 [place] Syndrome Carpal tunnel syndrome is a condition that causes pain in your hand and arm. The carpal tunnel is a narrow area located on the palm side of your wrist. Repeated wrist motion or certain diseases may cause swelling within the tunnel. This swelling pinches the main nerve in the wrist (median nerve). CAUSES  This condition may be caused by:   Repeated wrist motions.  Wrist injuries.  Arthritis.  A cyst or tumor in the carpal tunnel.  Fluid buildup during pregnancy. Sometimes the cause of this condition is not known.  RISK FACTORS This condition is more likely to develop in:   People who have jobs that cause them to repeatedly move their wrists in the same motion, such as butchers and cashiers.  Women.  People with certain conditions, such as:  Diabetes.  Obesity.  An underactive thyroid (hypothyroidism).  Kidney failure. SYMPTOMS  Symptoms of this condition include:   A tingling feeling in your fingers, especially in your thumb, index, and middle fingers.  Tingling or numbness in your hand.  An aching feeling in your entire arm, especially when your wrist and elbow are bent for long periods of time.  Wrist pain that goes up your arm to your shoulder.  Pain that goes down into your palm or fingers.  A weak feeling in your hands. You may have trouble grabbing and holding items. Your symptoms may feel worse during the  night.  DIAGNOSIS  This condition is diagnosed with a medical history and physical exam. You may also have tests, including:   An electromyogram (EMG). This test measures electrical signals sent by  your nerves into the muscles.  X-rays. TREATMENT  Treatment for this condition includes:  Lifestyle changes. It is important to stop doing or modify the activity that caused your condition.  Physical or occupational therapy.  Medicines for pain and inflammation. This may include medicine that is injected into your wrist.  A wrist splint.  Surgery. HOME CARE INSTRUCTIONS  If You Have a Splint:  Wear it as told by your health care provider. Remove it only as told by your health care provider.  Loosen the splint if your fingers become numb and tingle, or if they turn cold and blue.  Keep the splint clean and dry. General Instructions  Take over-the-counter and prescription medicines only as told by your health care provider.  Rest your wrist from any activity that may be causing your pain. If your condition is work related, talk to your employer about changes that can be made, such as getting a wrist pad to use while typing.  If directed, apply ice to the painful area:  Put ice in a plastic bag.  Place a towel between your skin and the bag.  Leave the ice on for 20 minutes, 2-3 times per day.  Keep all follow-up visits as told by your health care provider. This is important.  Do any exercises as told by your health care provider, physical therapist, or occupational therapist. [place] IF:   You have new symptoms.  Your pain is not controlled with medicines.  Your symptoms get worse.   This information is not intended to replace advice given to you by your health care provider. Make sure you discuss any questions you have with your health care provider.   Document Released: 01/26/2000 Document Revised: 10/19/2014 Document Reviewed: 06/15/2014 Elsevier Interactive Patient Education 2016 [place].  Gastroesophageal Reflux Disease, Adult Normally, food travels down the esophagus and stays in the stomach to be digested. However, when a person has  gastroesophageal reflux disease (GERD), food and stomach acid move back up into the esophagus. When this happens, the esophagus becomes sore and inflamed. Over time, GERD can create small holes (ulcers) in the lining of the esophagus.  CAUSES This condition is caused by a problem with the muscle between the esophagus and the stomach (lower esophageal sphincter, or LES). Normally, the LES muscle closes after food passes through the esophagus to the stomach. When the LES is weakened or abnormal, it does not close properly, and that allows food and stomach acid to go back up into the esophagus. The LES can be weakened by certain dietary substances, medicines, and medical conditions, including:  Tobacco use.  Pregnancy.  Having a hiatal hernia.  Heavy alcohol use.  Certain foods and beverages, such as coffee, chocolate, onions, and peppermint. RISK FACTORS This condition is more likely to develop in:  People who have an increased body weight.  People who have connective tissue disorders.  People who use NSAID medicines. SYMPTOMS Symptoms of this condition include:  Heartburn.  Difficult or painful swallowing.  The feeling of having a lump in the throat.  Abitter taste in the mouth.  Bad breath.  Having a large amount of saliva.  Having an upset or bloated stomach.  Belching.  Chest pain.  Shortness of breath or wheezing.  Ongoing (chronic) cough or  a night-time cough.  Wearing away of tooth enamel.  Weight loss. Different conditions can cause chest pain. Make sure to see your health care provider if you experience chest pain. DIAGNOSIS Your health care provider will take a medical history and perform a physical exam. To determine if you have mild or severe GERD, your health care provider may also monitor how you respond to treatment. You may also have other tests, including:  An endoscopy toexamine your stomach and esophagus with a small camera.  A test  thatmeasures the acidity level in your esophagus.  A test thatmeasures how much pressure is on your esophagus.  A barium swallow or modified barium swallow to show the shape, size, and functioning of your esophagus. TREATMENT The goal of treatment is to help relieve your symptoms and to prevent complications. Treatment for this condition may vary depending on how severe your symptoms are. Your health care provider may recommend:  Changes to your diet.  Medicine.  Surgery. HOME CARE INSTRUCTIONS Diet  Follow a diet as recommended by your health care provider. This may involve avoiding foods and drinks such as:  Coffee and tea (with or without caffeine).  Drinks that containalcohol.  Energy drinks and sports drinks.  Carbonated drinks or sodas.  Chocolate and cocoa.  Peppermint and mint flavorings.  Garlic and onions.  Horseradish.  Spicy and acidic foods, including peppers, chili powder, curry powder, vinegar, hot sauces, and barbecue sauce.  Citrus fruit juices and citrus fruits, such as oranges, lemons, and limes.  Tomato-based foods, such as red sauce, chili, salsa, and pizza with red sauce.  Fried and fatty foods, such as donuts, french fries, potato chips, and high-fat dressings.  High-fat meats, such as hot dogs and fatty cuts of red and white meats, such as rib eye steak, sausage, ham, and bacon.  High-fat dairy items, such as whole milk, butter, and cream cheese.  Eat small, frequent meals instead of large meals.  Avoid drinking large amounts of liquid with your meals.  Avoid eating meals during the 2-3 hours before bedtime.  Avoid lying down right after you eat.  Do not exercise right after you eat. General Instructions  Pay attention to any changes in your symptoms.  Take over-the-counter and prescription medicines only as told by your health care provider. Do not take aspirin, ibuprofen, or other NSAIDs unless your health care provider told  you to do so.  Do not use any tobacco products, including cigarettes, chewing tobacco, and e-cigarettes. If you need help quitting, ask your health care provider.  Wear loose-fitting clothing. Do not wear anything tight around your waist that causes pressure on your abdomen.  Raise (elevate) the head of your bed 6 inches (15cm).  Try to reduce your stress, such as with yoga or meditation. If you need help reducing stress, ask your health care provider.  If you are overweight, reduce your weight to an amount that is healthy for you. Ask your health care provider for guidance about a safe weight loss goal.  Keep all follow-up visits as told by your health care provider. This is important. SEEK MEDICAL CARE IF:  You have new symptoms.  You have unexplained weight loss.  You have difficulty swallowing, or it hurts to swallow.  You have wheezing or a persistent cough.  Your symptoms do not improve with treatment.  You have a hoarse voice. SEEK IMMEDIATE MEDICAL CARE IF:  You have pain in your arms, neck, jaw, teeth, or back.  You  feel sweaty, dizzy, or light-headed.  You have chest pain or shortness of breath.  You vomit and your vomit looks like blood or coffee grounds.  You faint.  Your stool is bloody or black.  You cannot swallow, drink, or eat.   This information is not intended to replace advice given to you by your health care provider. Make sure you discuss any questions you have with your health care provider.   Document Released: 11/07/2004 Document Revised: 10/19/2014 Document Reviewed: 05/25/2014 Elsevier Interactive Patient Education [employer].

## 2014-12-12 ENCOUNTER — Other Ambulatory Visit: Payer: Self-pay

## 2014-12-12 DIAGNOSIS — Z1231 Encounter for screening mammogram for malignant neoplasm of breast: Secondary | ICD-10-CM

## 2014-12-13 ENCOUNTER — Other Ambulatory Visit: Payer: Self-pay | Admitting: Family Medicine

## 2015-01-12 ENCOUNTER — Ambulatory Visit: Payer: Self-pay

## 2015-01-13 ENCOUNTER — Other Ambulatory Visit: Payer: Self-pay | Admitting: Family Medicine

## 2015-01-18 ENCOUNTER — Telehealth: Payer: Self-pay

## 2015-01-18 ENCOUNTER — Other Ambulatory Visit: Payer: Self-pay | Admitting: Family Medicine

## 2015-01-18 MED ORDER — LISINOPRIL-HYDROCHLOROTHIAZIDE 20-12.5 MG PO TABS: ORAL_TABLET | ORAL | Status: DC

## 2015-01-18 MED ORDER — COLCHICINE 0.6 MG PO CAPS: ORAL_CAPSULE | ORAL | Status: DC

## 2015-01-18 NOTE — Telephone Encounter (Signed)
Rx sent in

## 2015-01-18 NOTE — Telephone Encounter (Addendum)
Pt states she had a complete pe with Dr Lorelei Pont in August and was sure all her meds would have been updated at that time, was told no more refills without an ov, but that was just 2 months Ago. Need to have her LISINOPRIL 20-12.5 MG and COLCHICINE 0.6 MG CAPS refilled Please call pt at [place]

## 2015-01-19 NOTE — Telephone Encounter (Signed)
Done

## 2015-01-20 NOTE — Telephone Encounter (Signed)
Faxed

## 2015-02-08 ENCOUNTER — Other Ambulatory Visit: Payer: Self-pay | Admitting: Emergency Medicine

## 2015-02-20 ENCOUNTER — Other Ambulatory Visit: Payer: Self-pay | Admitting: Family Medicine

## 2015-02-23 ENCOUNTER — Other Ambulatory Visit: Payer: Self-pay | Admitting: Family Medicine

## 2015-02-23 ENCOUNTER — Ambulatory Visit (INDEPENDENT_AMBULATORY_CARE_PROVIDER_SITE_OTHER): Payer: BLUE CROSS/BLUE SHIELD | Admitting: Family Medicine

## 2015-02-23 ENCOUNTER — Ambulatory Visit (INDEPENDENT_AMBULATORY_CARE_PROVIDER_SITE_OTHER): Payer: BLUE CROSS/BLUE SHIELD

## 2015-02-23 VITALS — BP 130/88 | HR 88 | Temp 98.4°F | Resp 16 | Ht 65.25 in | Wt 280.2 lb

## 2015-02-23 DIAGNOSIS — R7303 Prediabetes: Secondary | ICD-10-CM | POA: Diagnosis not present

## 2015-02-23 DIAGNOSIS — M79641 Pain in right hand: Secondary | ICD-10-CM

## 2015-02-23 DIAGNOSIS — M25531 Pain in right wrist: Secondary | ICD-10-CM | POA: Diagnosis not present

## 2015-02-23 DIAGNOSIS — E663 Overweight: Secondary | ICD-10-CM

## 2015-02-23 DIAGNOSIS — I10 Essential (primary) hypertension: Secondary | ICD-10-CM | POA: Diagnosis not present

## 2015-02-23 MED ORDER — METOPROLOL TARTRATE 50 MG PO TABS: ORAL_TABLET | ORAL | Status: DC

## 2015-02-23 MED ORDER — AMLODIPINE BESYLATE 5 MG PO TABS: ORAL_TABLET | ORAL | Status: DC

## 2015-02-23 MED ORDER — LISINOPRIL-HYDROCHLOROTHIAZIDE 20-12.5 MG PO TABS: ORAL_TABLET | ORAL | Status: DC

## 2015-02-23 NOTE — Telephone Encounter (Signed)
This was called into [facility] [address] per pt request.

## 2015-02-23 NOTE — Patient Instructions (Addendum)
Return in next 3 months for diabetes test and other bloodwork, work on weight loss with increase in exercise, and portion control/diet changes with avoidance of sugar containing beverages.  I do not see any fractures on your wrist xray, but can wear the wrist brace for the next week to 10 days as needed, then recheck here or with Dr. Rip Harbour if not improved. Ultram if over the counter Advil or tylenol are not helping. Return to the clinic or go to the nearest emergency room if any of your symptoms worsen or new symptoms occur.  No change in blood pressure medicines for now.   Wrist Sprain With Rehab A sprain is an injury in which a ligament that maintains the proper alignment of a joint is partially or completely torn. The ligaments of the wrist are susceptible to sprains. Sprains are classified into three categories. Grade 1 sprains cause pain, but the tendon is not lengthened. Grade 2 sprains include a lengthened ligament because the ligament is stretched or partially ruptured. With grade 2 sprains there is still function, although the function may be diminished. Grade 3 sprains are characterized by a complete tear of the tendon or muscle, and function is usually impaired. SYMPTOMS   Pain tenderness, inflammation, and/or bruising (contusion) of the injury.  A "pop" or tear felt and/or heard at the time of injury.  Decreased wrist function. CAUSES  A wrist sprain occurs when a force is placed on one or more ligaments that is greater than it/they can withstand. Common mechanisms of injury include:  Catching a ball with your hands.  Repetitive and/ or strenuous extension or flexion of the wrist. RISK INCREASES WITH:  Previous wrist injury.  Contact sports (boxing or wrestling).  Activities in which falling is common.  Poor strength and flexibility.  Improperly fitted or padded protective equipment. PREVENTION  Warm up and stretch properly before activity.  Allow for adequate  recovery between workouts.  Maintain physical fitness:  Strength, flexibility, and endurance.  Cardiovascular fitness.  Protect the wrist joint by limiting its motion with the use of taping, braces, or splints.  Protect the wrist after injury for 6 to 12 months. PROGNOSIS  The prognosis for wrist sprains depends on the degree of injury. Grade 1 sprains require 2 to 6 weeks of treatment. Grade 2 sprains require 6 to 8 weeks of treatment, and grade 3 sprains require up to 12 weeks.  RELATED COMPLICATIONS   Prolonged healing time, if improperly treated or re-injured.  Recurrent symptoms that result in a chronic problem.  Injury to nearby structures (bone, cartilage, nerves, or tendons).  Arthritis of the wrist.  Inability to compete in athletics at a high level.  Wrist stiffness or weakness.  Progression to a complete rupture of the ligament. TREATMENT  Treatment initially involves resting from any activities that aggravate the symptoms, and the use of ice and medications to help reduce pain and inflammation. Your caregiver may recommend immobilizing the wrist for a period of time in order to reduce stress on the ligament and allow for healing. After immobilization it is important to perform strengthening and stretching exercises to help regain strength and a full range of motion. These exercises may be completed at home or with a therapist. Surgery is not usually required for wrist sprains, unless the ligament has been ruptured (grade 3 sprain). MEDICATION   If pain medication is necessary, then nonsteroidal anti-inflammatory medications, such as aspirin and ibuprofen, or other minor pain relievers, such as acetaminophen, are  often recommended.  Do not take pain medication for 7 days before surgery.  Prescription pain relievers may be given if deemed necessary by your caregiver. Use only as directed and only as much as you need. HEAT AND COLD  Cold treatment (icing) relieves pain  and reduces inflammation. Cold treatment should be applied for 10 to 15 minutes every 2 to 3 hours for inflammation and pain and immediately after any activity that aggravates your symptoms. Use ice packs or massage the area with a piece of ice (ice massage).  Heat treatment may be used prior to performing the stretching and strengthening activities prescribed by your caregiver, physical therapist, or athletic trainer. Use a heat pack or soak your injury in warm water. SEEK MEDICAL CARE IF:  Treatment seems to offer no benefit, or the condition worsens.  Any medications produce adverse side effects. EXERCISES RANGE OF MOTION (ROM) AND STRETCHING EXERCISES - Wrist Sprain  These exercises may help you when beginning to rehabilitate your injury. Your symptoms may resolve with or without further involvement from your physician, physical therapist or athletic trainer. While completing these exercises, remember:   Restoring tissue flexibility helps normal motion to return to the joints. This allows healthier, less painful movement and activity.  An effective stretch should be held for at least 30 seconds.  A stretch should never be painful. You should only feel a gentle lengthening or release in the stretched tissue. RANGE OF MOTION - Wrist Flexion, Active-Assisted  Extend your right / left elbow with your fingers pointing down.*  Gently pull the back of your hand towards you until you feel a gentle stretch on the top of your forearm.  Hold this position for __________ seconds. Repeat __________ times. Complete this exercise __________ times per day.  *If directed by your physician, physical therapist or athletic trainer, complete this stretch with your elbow bent rather than extended. RANGE OF MOTION - Wrist Extension, Active-Assisted  Extend your right / left elbow and turn your palm upwards.*  Gently pull your palm/fingertips back so your wrist extends and your fingers point more toward  the ground.  You should feel a gentle stretch on the inside of your forearm.  Hold this position for __________ seconds. Repeat __________ times. Complete this exercise __________ times per day. *If directed by your physician, physical therapist or athletic trainer, complete this stretch with your elbow bent, rather than extended. RANGE OF MOTION - Supination, Active  Stand or sit with your elbows at your side. Bend your right / left elbow to 90 degrees.  Turn your palm upward until you feel a gentle stretch on the inside of your forearm.  Hold this position for __________ seconds. Slowly release and return to the starting position. Repeat __________ times. Complete this stretch __________ times per day.  RANGE OF MOTION - Pronation, Active  Stand or sit with your elbows at your side. Bend your right / left elbow to 90 degrees.  Turn your palm downward until you feel a gentle stretch on the top of your forearm.  Hold this position for __________ seconds. Slowly release and return to the starting position. Repeat __________ times. Complete this stretch __________ times per day.  STRETCH - Wrist Flexion  Place the back of your right / left hand on a tabletop leaving your elbow slightly bent. Your fingers should point away from your body.  Gently press the back of your hand down onto the table by straightening your elbow. You should feel a stretch  on the top of your forearm.  Hold this position for __________ seconds. Repeat __________ times. Complete this stretch __________ times per day.  STRETCH - Wrist Extension  Place your right / left fingertips on a tabletop leaving your elbow slightly bent. Your fingers should point backwards.  Gently press your fingers and palm down onto the table by straightening your elbow. You should feel a stretch on the inside of your forearm.  Hold this position for __________ seconds. Repeat __________ times. Complete this stretch __________ times  per day.  STRENGTHENING EXERCISES - Wrist Sprain These exercises may help you when beginning to rehabilitate your injury. They may resolve your symptoms with or without further involvement from your physician, physical therapist or athletic trainer. While completing these exercises, remember:   Muscles can gain both the endurance and the strength needed for everyday activities through controlled exercises.  Complete these exercises as instructed by your physician, physical therapist or athletic trainer. Progress with the resistance and repetition exercises only as your caregiver advises. STRENGTH - Wrist Flexors  Sit with your right / left forearm palm-up and fully supported. Your elbow should be resting below the height of your shoulder. Allow your wrist to extend over the edge of the surface.  Loosely holding a __________ weight or a piece of rubber exercise band/tubing, slowly curl your hand up toward your forearm.  Hold this position for __________ seconds. Slowly lower the wrist back to the starting position in a controlled manner. Repeat __________ times. Complete this exercise __________ times per day.  STRENGTH - Wrist Extensors  Sit with your right / left forearm palm-down and fully supported. Your elbow should be resting below the height of your shoulder. Allow your wrist to extend over the edge of the surface.  Loosely holding a __________ weight or a piece of rubber exercise band/tubing, slowly curl your hand up toward your forearm.  Hold this position for __________ seconds. Slowly lower the wrist back to the starting position in a controlled manner. Repeat __________ times. Complete this exercise __________ times per day.  STRENGTH - Ulnar Deviators  Stand with a ____________________ weight in your right / left hand, or sit holding on to the rubber exercise band/tubing with your opposite arm supported.  Move your wrist so that your pinkie travels toward your forearm and your  thumb moves away from your forearm.  Hold this position for __________ seconds and then slowly lower the wrist back to the starting position. Repeat __________ times. Complete this exercise __________ times per day STRENGTH - Radial Deviators  Stand with a ____________________ weight in your  right / left hand, or sit holding on to the rubber exercise band/tubing with your arm supported.  Raise your hand upward in front of you or pull up on the rubber tubing.  Hold this position for __________ seconds and then slowly lower the wrist back to the starting position. Repeat __________ times. Complete this exercise __________ times per day. STRENGTH - Forearm Supinators  Sit with your right / left forearm supported on a table, keeping your elbow below shoulder height. Rest your hand over the edge, palm down.  Gently grip a hammer or a soup ladle.  Without moving your elbow, slowly turn your palm and hand upward to a "thumbs-up" position.  Hold this position for __________ seconds. Slowly return to the starting position. Repeat __________ times. Complete this exercise __________ times per day.  STRENGTH - Forearm Pronators  Sit with your right / left forearm  supported on a table, keeping your elbow below shoulder height. Rest your hand over the edge, palm up.  Gently grip a hammer or a soup ladle.  Without moving your elbow, slowly turn your palm and hand upward to a "thumbs-up" position.  Hold this position for __________ seconds. Slowly return to the starting position. Repeat __________ times. Complete this exercise __________ times per day.  STRENGTH - Grip  Grasp a tennis ball, a dense sponge, or a large, rolled sock in your hand.  Squeeze as hard as you can without increasing any pain.  Hold this position for __________ seconds. Release your grip slowly. Repeat __________ times. Complete this exercise __________ times per day.    This information is not intended to replace  advice given to you by your health care provider. Make sure you discuss any questions you have with your health care provider.   Document Released: 01/28/2005 Document Revised: 10/19/2014 Document Reviewed: 05/12/2008 Elsevier Interactive Patient Education [employer].

## 2015-02-23 NOTE — Telephone Encounter (Signed)
Called in.

## 2015-02-23 NOTE — Progress Notes (Signed)
Subjective:  By signing my name below, I, Rawaa Al Rifaie, attest that this documentation has been prepared under the direction and in the presence of Merri Ray, MD.  Leandra Kern, Medical Scribe. 02/23/2015.  9:04 AM. I personally performed the services described in this documentation, which was scribed in my presence. The recorded information has been reviewed and considered, and addended by me as needed.      Patient ID: Sierra Shields, female    DOB: 01/09/1951, 65 y.o.   MRN: [id number]  Chief Complaint  Patient presents with  . Wrist Injury    RIGHT - Monday morning 2017  . Medication Refill    tramadol, metoprolol, norvasc, and lisinopril/HCTZ    HPI HPI Comments: Sierra Shields is a 65 y.o. female who presents to [facility] with multiple complaints: Appears that she was prev a pt of Dr. Leward Quan . She last had a physical with Dr. Lorelei Pont in August of last year.   Right Wrist injury: Pt reports that her injury occurred 3 days ago. She indicates that a baby was falling over a shopping cart, and as she was trying to reach over and catch the baby, she injured her wrist in the process. She notes that she felt a pop when she caught the baby. Pt states that it is very painful to the touch, and is painful to move her fingers. Pt applied a cold pack, andwore a left arm brace on the area, and reports mild relief with it. She took motrin for the symptoms. She denies swelling of the area, or any previous injuries.   Refill for tramadol: She was last given this medication on 01/11 #40, prescribed by Dr. Lorelei Pont. She had osteoarthritis of her knee noted at her August visit, and by notes, pt uses tramadol and relafenas needed.  Today pt notes that she takes the medication about 20 days in a month. She is followed by Dr. Rip Harbour in [place] for her arthritis.   Hypertension: Pt need refills of 3 medications today.  Lab Results  Component Value  Date   CREATININE 0.79 12/05/2014  She had a lipid panel that was nl in August 2016. Today, pt states that she is compliant with taking all 3 of medications. She denies experiencing any side effects with them. She indicates that she does not regularly check her blood pressure.   Pre- diabetes: Pt was known to have elevated blood sugar in October of 2016, with A1C of 5.8 in August indicating pre-diabetes.  Wt Readings from Last 3 Encounters:  02/23/15 280 lb 3.2 oz (127.098 kg)  12/05/14 272 lb 6 oz (123.548 kg)  11/05/14 273 lb (123.832 kg)  pt states that she does not regularly exercise. 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     Patient Active Problem List   Diagnosis Date Noted  . Obesity, Class III, BMI 40-49.9 (morbid obesity) ([place]) 04/04/2011  . Left knee DJD 04/04/2011  . Right knee DJD 04/04/2011  . Hypertension   . Anemia   . GERD (gastroesophageal reflux disease)   . Allergic rhinitis    Past Medical History  Diagnosis Date  . Hypertension   . Anemia   . GERD (gastroesophageal reflux disease)   . Allergic rhinitis   . Arthritis    Past Surgical History  Procedure Laterality Date  . Appendectomy    . Cholecystectomy    .  Total abdominal hysterectomy  06/2001   Allergies  Allergen Reactions  . Ampicillin Other (See Comments)    Causes blisters  . Penicillins Hives  . Latex Rash   Prior to Admission medications   Medication Sig Start Date End Date Taking? Authorizing Provider  amLODipine (NORVASC) 5 MG tablet TAKE ONE TABLET BY MOUTH ONCE DAILY. 09/19/14  Yes Gay Filler Copland, MD  aspirin 81 MG tablet Take 81 mg by mouth every morning.    Yes Historical Provider, MD  BLACK COHOSH PO Take by mouth.   Yes Historical Provider, MD  Colchicine 0.6 MG CAPS Take 2 capsules now and 1 capsule in one hour.  1 capsule daily starting tomorrow. 01/18/15  Yes Shawnee Knapp, MD   glucosamine-chondroitin 500-400 MG tablet Take 2 tablets by mouth every morning.   Yes Historical Provider, MD  lisinopril-hydrochlorothiazide (PRINZIDE,ZESTORETIC) 20-12.5 MG tablet TAKE ONE TABLET BY MOUTH IN THE MORNING  "NO REFILLS WITHOUT OFFICE VISIT" 01/18/15  Yes Shawnee Knapp, MD  metoprolol (LOPRESSOR) 50 MG tablet TAKE ONE TABLET BY MOUTH TWICE DAILY 09/19/14  Yes Gay Filler Copland, MD  nabumetone (RELAFEN) 750 MG tablet Take 1 tablet (750 mg total) by mouth 2 (two) times daily as needed. Use as needed for joint pain 09/19/14  Yes Gay Filler Copland, MD  omeprazole (PRILOSEC) 40 MG capsule Take 1 capsule (40 mg total) by mouth daily. 30 minutes before dinner 12/05/14  Yes Shawnee Knapp, MD  potassium chloride (KLOR-CON M10) 10 MEQ tablet Take 1 tablet (10 mEq total) by mouth daily. 01/20/15  Yes Jaynee Eagles, PA-C  traMADol (ULTRAM) 50 MG tablet Take 1 tablet (50 mg total) by mouth every 8 (eight) hours as needed. 02/22/15  Yes Darreld Mclean, MD   Social History   Social History  . Marital Status: Single    Spouse Name: N/A  . Number of Children: N/A  . Years of Education: N/A   Occupational History  . Restaurant Mgr    Social History Main Topics  . Smoking status: Never Smoker   . Smokeless tobacco: Not on file  . Alcohol Use: No     Comment: special occasions-rare  . Drug Use: No  . Sexual Activity: Yes    Birth Control/ Protection: None   Other Topics Concern  . Not on file   Social History Narrative   Significant other. Education: [employer]. Exercise: No.    Review of Systems  Constitutional: Negative for fatigue and unexpected weight change.  Respiratory: Negative for chest tightness and shortness of breath.   Cardiovascular: Negative for chest pain, palpitations and leg swelling.  Gastrointestinal: Negative for abdominal pain and blood in stool.  Musculoskeletal: Positive for arthralgias. Negative for joint swelling.  Neurological: Negative for dizziness, syncope,  light-headedness and headaches.      Objective:   Physical Exam  Constitutional: She is oriented to person, place, and time. She appears well-developed and well-nourished. No distress.  HENT:  Head: Normocephalic and atraumatic.  Eyes: Conjunctivae and EOM are normal. Pupils are equal, round, and reactive to light.  Neck: Neck supple. Carotid bruit is not present.  Cardiovascular: Normal rate, regular rhythm, normal heart sounds and intact distal pulses.   Pulmonary/Chest: Effort normal and breath sounds normal.  Abdominal: Soft. She exhibits no pulsatile midline mass. There is no tenderness.  Musculoskeletal: She exhibits tenderness.  FROM. No tenderness. Forearm is non tender. Most tenderness over the base of the first cmc and the first mcp joint. Scaphoid  is also tender. No appreciable tenderness over the distal radius. Pain over the carpal tunnel. She is guarded with some tenderness over the ulnar hand as well. Flexion and extension is intact in all fingers and thump.   Neurological: She is alert and oriented to person, place, and time. No cranial nerve deficit.  Skin: Skin is warm and dry.  Psychiatric: She has a normal mood and affect. Her behavior is normal.  Nursing note and vitals reviewed.   [facility] (PRIMARY) x-ray report read by Dr. Merri Ray, MD: Right wrist and right hand- she had some degenerative changes in the carpals and at the cmc of the thump. No apparent fracture otherwise.   Filed Vitals:   02/23/15 0827  BP: 130/88  Pulse: 88  Temp: 98.4 F (36.9 C)  TempSrc: Oral  Resp: 16  Height: 5' 5.25" (1.657 m)  Weight: 280 lb 3.2 oz (127.098 kg)  SpO2: 96%      Assessment & Plan:   YENTL RUDESILL is a 65 y.o. female Essential hypertension - Plan: amLODipine (NORVASC) 5 MG tablet, lisinopril-hydrochlorothiazide (PRINZIDE,ZESTORETIC) 20-12.5 MG tablet, metoprolol (LOPRESSOR) 50 MG tablet  -stable. meds refilled. Plan on labs at next ov in 3 months.    Prediabetes Overweight  - wt loss discussed and ways to achieve this. Plan on A1c at next ov.   Right wrist pain - Plan: CANCELED: DG Wrist Complete Right Right hand pain - Plan: DG Hand Complete Right  -sprain vs flair of OA at CMC/MCP of thumb.   -spica splint placed, symptomatic care discussed, ROM as tolerated out of splint few times per day, and recheck here or ortho in next 10 days if not significantly improved. Sooner if worse.   -h/o given on sprain.   Meds ordered this encounter  Medications  . amLODipine (NORVASC) 5 MG tablet    Sig: TAKE ONE TABLET BY MOUTH ONCE DAILY.    Dispense:  90 tablet    Refill:  1  . lisinopril-hydrochlorothiazide (PRINZIDE,ZESTORETIC) 20-12.5 MG tablet    Sig: TAKE ONE TABLET BY MOUTH IN THE MORNING    Dispense:  90 tablet    Refill:  1  . metoprolol (LOPRESSOR) 50 MG tablet    Sig: TAKE ONE TABLET BY MOUTH TWICE DAILY    Dispense:  180 tablet    Refill:  1   Patient Instructions  Return in next 3 months for diabetes test and other bloodwork, work on weight loss with increase in exercise, and portion control/diet changes with avoidance of sugar containing beverages.  I do not see any fractures on your wrist xray, but can wear the wrist brace for the next week to 10 days as needed, then recheck here or with Dr. Rip Harbour if not improved. Ultram if over the counter Advil or tylenol are not helping. Return to the clinic or go to the nearest emergency room if any of your symptoms worsen or new symptoms occur.  No change in blood pressure medicines for now.   Wrist Sprain With Rehab A sprain is an injury in which a ligament that maintains the proper alignment of a joint is partially or completely torn. The ligaments of the wrist are susceptible to sprains. Sprains are classified into three categories. Grade 1 sprains cause pain, but the tendon is not lengthened. Grade 2 sprains include a lengthened ligament because the ligament is stretched or  partially ruptured. With grade 2 sprains there is still function, although the function may be diminished. Grade 3  sprains are characterized by a complete tear of the tendon or muscle, and function is usually impaired. SYMPTOMS   Pain tenderness, inflammation, and/or bruising (contusion) of the injury.  A "pop" or tear felt and/or heard at the time of injury.  Decreased wrist function. CAUSES  A wrist sprain occurs when a force is placed on one or more ligaments that is greater than it/they can withstand. Common mechanisms of injury include:  Catching a ball with your hands.  Repetitive and/ or strenuous extension or flexion of the wrist. RISK INCREASES WITH:  Previous wrist injury.  Contact sports (boxing or wrestling).  Activities in which falling is common.  Poor strength and flexibility.  Improperly fitted or padded protective equipment. PREVENTION  Warm up and stretch properly before activity.  Allow for adequate recovery between workouts.  Maintain physical fitness:  Strength, flexibility, and endurance.  Cardiovascular fitness.  Protect the wrist joint by limiting its motion with the use of taping, braces, or splints.  Protect the wrist after injury for 6 to 12 months. PROGNOSIS  The prognosis for wrist sprains depends on the degree of injury. Grade 1 sprains require 2 to 6 weeks of treatment. Grade 2 sprains require 6 to 8 weeks of treatment, and grade 3 sprains require up to 12 weeks.  RELATED COMPLICATIONS   Prolonged healing time, if improperly treated or re-injured.  Recurrent symptoms that result in a chronic problem.  Injury to nearby structures (bone, cartilage, nerves, or tendons).  Arthritis of the wrist.  Inability to compete in athletics at a high level.  Wrist stiffness or weakness.  Progression to a complete rupture of the ligament. TREATMENT  Treatment initially involves resting from any activities that aggravate the symptoms, and the  use of ice and medications to help reduce pain and inflammation. Your caregiver may recommend immobilizing the wrist for a period of time in order to reduce stress on the ligament and allow for healing. After immobilization it is important to perform strengthening and stretching exercises to help regain strength and a full range of motion. These exercises may be completed at home or with a therapist. Surgery is not usually required for wrist sprains, unless the ligament has been ruptured (grade 3 sprain). MEDICATION   If pain medication is necessary, then nonsteroidal anti-inflammatory medications, such as aspirin and ibuprofen, or other minor pain relievers, such as acetaminophen, are often recommended.  Do not take pain medication for 7 days before surgery.  Prescription pain relievers may be given if deemed necessary by your caregiver. Use only as directed and only as much as you need. HEAT AND COLD  Cold treatment (icing) relieves pain and reduces inflammation. Cold treatment should be applied for 10 to 15 minutes every 2 to 3 hours for inflammation and pain and immediately after any activity that aggravates your symptoms. Use ice packs or massage the area with a piece of ice (ice massage).  Heat treatment may be used prior to performing the stretching and strengthening activities prescribed by your caregiver, physical therapist, or athletic trainer. Use a heat pack or soak your injury in warm water. SEEK MEDICAL CARE IF:  Treatment seems to offer no benefit, or the condition worsens.  Any medications produce adverse side effects. EXERCISES RANGE OF MOTION (ROM) AND STRETCHING EXERCISES - Wrist Sprain  These exercises may help you when beginning to rehabilitate your injury. Your symptoms may resolve with or without further involvement from your physician, physical therapist or athletic trainer. While completing these exercises,  remember:   Restoring tissue flexibility helps normal motion to  return to the joints. This allows healthier, less painful movement and activity.  An effective stretch should be held for at least 30 seconds.  A stretch should never be painful. You should only feel a gentle lengthening or release in the stretched tissue. RANGE OF MOTION - Wrist Flexion, Active-Assisted  Extend your right / left elbow with your fingers pointing down.*  Gently pull the back of your hand towards you until you feel a gentle stretch on the top of your forearm.  Hold this position for __________ seconds. Repeat __________ times. Complete this exercise __________ times per day.  *If directed by your physician, physical therapist or athletic trainer, complete this stretch with your elbow bent rather than extended. RANGE OF MOTION - Wrist Extension, Active-Assisted  Extend your right / left elbow and turn your palm upwards.*  Gently pull your palm/fingertips back so your wrist extends and your fingers point more toward the ground.  You should feel a gentle stretch on the inside of your forearm.  Hold this position for __________ seconds. Repeat __________ times. Complete this exercise __________ times per day. *If directed by your physician, physical therapist or athletic trainer, complete this stretch with your elbow bent, rather than extended. RANGE OF MOTION - Supination, Active  Stand or sit with your elbows at your side. Bend your right / left elbow to 90 degrees.  Turn your palm upward until you feel a gentle stretch on the inside of your forearm.  Hold this position for __________ seconds. Slowly release and return to the starting position. Repeat __________ times. Complete this stretch __________ times per day.  RANGE OF MOTION - Pronation, Active  Stand or sit with your elbows at your side. Bend your right / left elbow to 90 degrees.  Turn your palm downward until you feel a gentle stretch on the top of your forearm.  Hold this position for __________  seconds. Slowly release and return to the starting position. Repeat __________ times. Complete this stretch __________ times per day.  STRETCH - Wrist Flexion  Place the back of your right / left hand on a tabletop leaving your elbow slightly bent. Your fingers should point away from your body.  Gently press the back of your hand down onto the table by straightening your elbow. You should feel a stretch on the top of your forearm.  Hold this position for __________ seconds. Repeat __________ times. Complete this stretch __________ times per day.  STRETCH - Wrist Extension  Place your right / left fingertips on a tabletop leaving your elbow slightly bent. Your fingers should point backwards.  Gently press your fingers and palm down onto the table by straightening your elbow. You should feel a stretch on the inside of your forearm.  Hold this position for __________ seconds. Repeat __________ times. Complete this stretch __________ times per day.  STRENGTHENING EXERCISES - Wrist Sprain These exercises may help you when beginning to rehabilitate your injury. They may resolve your symptoms with or without further involvement from your physician, physical therapist or athletic trainer. While completing these exercises, remember:   Muscles can gain both the endurance and the strength needed for everyday activities through controlled exercises.  Complete these exercises as instructed by your physician, physical therapist or athletic trainer. Progress with the resistance and repetition exercises only as your caregiver advises. STRENGTH - Wrist Flexors  Sit with your right / left forearm palm-up and fully supported.  Your elbow should be resting below the height of your shoulder. Allow your wrist to extend over the edge of the surface.  Loosely holding a __________ weight or a piece of rubber exercise band/tubing, slowly curl your hand up toward your forearm.  Hold this position for __________  seconds. Slowly lower the wrist back to the starting position in a controlled manner. Repeat __________ times. Complete this exercise __________ times per day.  STRENGTH - Wrist Extensors  Sit with your right / left forearm palm-down and fully supported. Your elbow should be resting below the height of your shoulder. Allow your wrist to extend over the edge of the surface.  Loosely holding a __________ weight or a piece of rubber exercise band/tubing, slowly curl your hand up toward your forearm.  Hold this position for __________ seconds. Slowly lower the wrist back to the starting position in a controlled manner. Repeat __________ times. Complete this exercise __________ times per day.  STRENGTH - Ulnar Deviators  Stand with a ____________________ weight in your right / left hand, or sit holding on to the rubber exercise band/tubing with your opposite arm supported.  Move your wrist so that your pinkie travels toward your forearm and your thumb moves away from your forearm.  Hold this position for __________ seconds and then slowly lower the wrist back to the starting position. Repeat __________ times. Complete this exercise __________ times per day STRENGTH - Radial Deviators  Stand with a ____________________ weight in your  right / left hand, or sit holding on to the rubber exercise band/tubing with your arm supported.  Raise your hand upward in front of you or pull up on the rubber tubing.  Hold this position for __________ seconds and then slowly lower the wrist back to the starting position. Repeat __________ times. Complete this exercise __________ times per day. STRENGTH - Forearm Supinators  Sit with your right / left forearm supported on a table, keeping your elbow below shoulder height. Rest your hand over the edge, palm down.  Gently grip a hammer or a soup ladle.  Without moving your elbow, slowly turn your palm and hand upward to a "thumbs-up" position.  Hold this  position for __________ seconds. Slowly return to the starting position. Repeat __________ times. Complete this exercise __________ times per day.  STRENGTH - Forearm Pronators  Sit with your right / left forearm supported on a table, keeping your elbow below shoulder height. Rest your hand over the edge, palm up.  Gently grip a hammer or a soup ladle.  Without moving your elbow, slowly turn your palm and hand upward to a "thumbs-up" position.  Hold this position for __________ seconds. Slowly return to the starting position. Repeat __________ times. Complete this exercise __________ times per day.  STRENGTH - Grip  Grasp a tennis ball, a dense sponge, or a large, rolled sock in your hand.  Squeeze as hard as you can without increasing any pain.  Hold this position for __________ seconds. Release your grip slowly. Repeat __________ times. Complete this exercise __________ times per day.    This information is not intended to replace advice given to you by your health care provider. Make sure you discuss any questions you have with your health care provider.   Document Released: 01/28/2005 Document Revised: 10/19/2014 Document Reviewed: 05/12/2008 Elsevier Interactive Patient Education [employer].     I personally performed the services described in this documentation, which was scribed in my presence. The recorded information has  been reviewed and considered, and addended by me as needed.

## 2015-02-28 ENCOUNTER — Telehealth: Payer: Self-pay

## 2015-02-28 MED ORDER — COLCHICINE 0.6 MG PO CAPS: ORAL_CAPSULE | ORAL | Status: DC

## 2015-02-28 NOTE — Telephone Encounter (Signed)
Got PA notice for Mitigare. The is normally just an issue of having pharm run through as generic also , and brand Colcrys. One of the three is normally preferred by ins and will be covered! Checked w/pharm who tried the generic in addition to [facility], and both needed PA. Pharm stated that they can't change to brand Colcrys with out a new Rx. I will send another Rx and pharm will let me know if it also needs a PA.

## 2015-03-06 ENCOUNTER — Ambulatory Visit (INDEPENDENT_AMBULATORY_CARE_PROVIDER_SITE_OTHER): Payer: BLUE CROSS/BLUE SHIELD | Admitting: Family Medicine

## 2015-03-06 ENCOUNTER — Encounter: Payer: Self-pay | Admitting: Family Medicine

## 2015-03-06 VITALS — BP 118/68 | HR 81 | Temp 98.6°F | Wt 274.0 lb

## 2015-03-06 DIAGNOSIS — Z23 Encounter for immunization: Secondary | ICD-10-CM

## 2015-03-06 DIAGNOSIS — R7303 Prediabetes: Secondary | ICD-10-CM

## 2015-03-06 DIAGNOSIS — R059 Cough, unspecified: Secondary | ICD-10-CM

## 2015-03-06 DIAGNOSIS — R05 Cough: Secondary | ICD-10-CM

## 2015-03-06 LAB — HEMOGLOBIN A1C
Hgb A1c MFr Bld: 6 % — ABNORMAL HIGH (ref <5.7)
Mean Plasma Glucose: 126 mg/dL — ABNORMAL HIGH (ref <117)

## 2015-03-06 MED ORDER — BENZONATATE 100 MG PO CAPS: 100.0000 mg | ORAL_CAPSULE | Freq: Three times a day (TID) | ORAL | Status: DC | PRN

## 2015-03-06 NOTE — Patient Instructions (Addendum)
It would be my pleasure to continue to see you as a patient at my new office, starting the last week of February.  If you prefer to remain at [facility] one of my partners will be happy to see you here.  Perhaps Dr. Tamala Julian or Dr. Gerald Stabs [facility] [address] [place],  [address] Phone: [phone]  Use the tessalon perles as needed for your cough I will check your A1c (average blood sugar over 3 months) today and will be back in touch with you You also got your flu shot today I would encourage you to work on weight loss!    Let me know if the cough is not resolved in about a week and let's plan to recheck in 6 months

## 2015-03-06 NOTE — Progress Notes (Signed)
[facility] [address] [place] [address] [phone]  Date:  03/06/2015   Name:  Sierra Shields   DOB:  [birth year]   MRN:  [id number]  PCP:  Ellsworth Lennox, MD    Chief Complaint: Cough; Follow-up; and Hypertension   History of Present Illness:  Sierra Shields is a 65 y.o. very pleasant female patient who presents with the following:  Here today for a follow-up visit. Last seen in August- at that time her A1c was 5.8 and we planned to recheck in 6 months or so  She also has a cough today- her family has been ill with a cold recently.   She has had the cough for about a week.   No fever Otherwise she has felt ok overall She had some nasal congestion but not bad Her recheck BP was fine- it is generally in normal range.   No meds for her cough at home She would like to get a flu shot today if she can   BP Readings from Last 3 Encounters:  03/06/15 118/68  02/23/15 130/88  12/05/14 130/78   Wt Readings from Last 3 Encounters:  03/06/15 274 lb (124.286 kg)  02/23/15 280 lb 3.2 oz (127.098 kg)  12/05/14 272 lb 6 oz (123.548 kg)     Patient Active Problem List   Diagnosis Date Noted  . Obesity, Class III, BMI 40-49.9 (morbid obesity) ([place]) 04/04/2011  . Left knee DJD 04/04/2011  . Right knee DJD 04/04/2011  . Hypertension   . Anemia   . GERD (gastroesophageal reflux disease)   . Allergic rhinitis     Past Medical History  Diagnosis Date  . Hypertension   . Anemia   . GERD (gastroesophageal reflux disease)   . Allergic rhinitis   . Arthritis     Past Surgical History  Procedure Laterality Date  . Appendectomy    . Cholecystectomy    . Total abdominal hysterectomy  06/2001    Social History  Substance Use Topics  . Smoking status: Never Smoker   . Smokeless tobacco: Never Used  . Alcohol Use: No     Comment: special occasions-rare    Family History  Problem Relation Age of Onset  . Diabetes Mother   . Hypertension  Mother   . Heart disease Mother   . Stroke Mother   . Diabetes Father   . Heart disease Father   . Hyperlipidemia Father   . Hypertension Father   . Hypertension    . Heart failure Sister   . Heart disease Maternal Grandmother   . Hypertension Maternal Grandmother   . Hyperlipidemia Maternal Grandfather   . Heart disease Paternal Grandfather   . Stroke Paternal Grandfather     Allergies  Allergen Reactions  . Ampicillin Other (See Comments)    Causes blisters  . Penicillins Hives  . Latex Rash    Medication list has been reviewed and updated.  Current Outpatient Prescriptions on File Prior to Visit  Medication Sig Dispense Refill  . amLODipine (NORVASC) 5 MG tablet TAKE ONE TABLET BY MOUTH ONCE DAILY. 90 tablet 1  . aspirin 81 MG tablet Take 81 mg by mouth every morning.     Marland Kitchen BLACK COHOSH PO Take by mouth.    . Colchicine 0.6 MG CAPS Take 2 capsules now and 1 capsule in one hour.  1 capsule daily starting tomorrow. 90 capsule 1  . glucosamine-chondroitin 500-400 MG tablet Take 2 tablets by mouth  every morning.    Marland Kitchen lisinopril-hydrochlorothiazide (PRINZIDE,ZESTORETIC) 20-12.5 MG tablet TAKE ONE TABLET BY MOUTH IN THE MORNING 90 tablet 1  . metoprolol (LOPRESSOR) 50 MG tablet TAKE ONE TABLET BY MOUTH TWICE DAILY 180 tablet 1  . nabumetone (RELAFEN) 750 MG tablet Take 1 tablet (750 mg total) by mouth 2 (two) times daily as needed. Use as needed for joint pain 60 tablet 5  . omeprazole (PRILOSEC) 40 MG capsule Take 1 capsule (40 mg total) by mouth daily. 30 minutes before dinner 30 capsule 2  . potassium chloride (KLOR-CON M10) 10 MEQ tablet Take 1 tablet (10 mEq total) by mouth daily. 90 tablet 2  . traMADol (ULTRAM) 50 MG tablet Take 1 tablet (50 mg total) by mouth every 8 (eight) hours as needed. 40 tablet 0   No current facility-administered medications on file prior to visit.    Review of Systems:  As per HPI- otherwise negative.   Physical Examination: Filed  Vitals:   03/06/15 1051 03/06/15 1054  BP: 150/90 118/68  Pulse: 81   Temp: 98.6 F (37 C)    Filed Vitals:   03/06/15 1051  Weight: 274 lb (124.286 kg)   Body mass index is 45.27 kg/(m^2). Ideal Body Weight:    GEN: WDWN, NAD, Non-toxic, A & O x 3, obese, looks well HEENT: Atraumatic, Normocephalic. Neck supple. No masses, No LAD.  Bilateral TM wnl, oropharynx normal.  PEERL,EOMI.   Ears and Nose: No external deformity. CV: RRR, No M/G/R. No JVD. No thrill. No extra heart sounds. PULM: CTA B, no wheezes, crackles, rhonchi. No retractions. No resp. distress. No accessory muscle use. EXTR: No c/c/e NEURO Normal gait.  PSYCH: Normally interactive. Conversant. Not depressed or anxious appearing.  Calm demeanor.   Assessment and Plan: Cough - Plan: benzonatate (TESSALON) 100 MG capsule  Pre-diabetes - Plan: Hemoglobin A1c  Immunization due - Plan: Flu Vaccine QUAD 36+ mos IM  Here today with a likely benign cough Tessalon perles prn Check A1c today Flu shot Will plan further follow- up pending labs.   Signed Lamar Blinks, MD

## 2015-03-29 ENCOUNTER — Other Ambulatory Visit: Payer: Self-pay | Admitting: Family Medicine

## 2015-04-18 ENCOUNTER — Other Ambulatory Visit: Payer: Self-pay | Admitting: Family Medicine

## 2015-04-18 DIAGNOSIS — M170 Bilateral primary osteoarthritis of knee: Secondary | ICD-10-CM

## 2015-04-20 ENCOUNTER — Other Ambulatory Visit: Payer: Self-pay

## 2015-04-20 DIAGNOSIS — I10 Essential (primary) hypertension: Secondary | ICD-10-CM

## 2015-04-20 MED ORDER — AMLODIPINE BESYLATE 5 MG PO TABS: ORAL_TABLET | ORAL | Status: DC

## 2015-04-20 MED ORDER — COLCHICINE 0.6 MG PO CAPS: ORAL_CAPSULE | ORAL | Status: DC

## 2015-04-20 MED ORDER — POTASSIUM CHLORIDE CRYS ER 10 MEQ PO TBCR: 10.0000 meq | EXTENDED_RELEASE_TABLET | Freq: Every day | ORAL | Status: AC

## 2015-04-26 ENCOUNTER — Other Ambulatory Visit: Payer: Self-pay

## 2015-04-26 DIAGNOSIS — I10 Essential (primary) hypertension: Secondary | ICD-10-CM

## 2015-04-26 MED ORDER — LISINOPRIL-HYDROCHLOROTHIAZIDE 20-12.5 MG PO TABS: ORAL_TABLET | ORAL | Status: DC

## 2015-04-26 MED ORDER — OMEPRAZOLE 40 MG PO CPDR: DELAYED_RELEASE_CAPSULE | ORAL | Status: DC

## 2015-04-26 MED ORDER — METOPROLOL TARTRATE 50 MG PO TABS: ORAL_TABLET | ORAL | Status: DC

## 2015-05-08 ENCOUNTER — Other Ambulatory Visit: Payer: Self-pay

## 2015-05-08 MED ORDER — COLCHICINE 0.6 MG PO CAPS: ORAL_CAPSULE | ORAL | Status: DC

## 2015-05-08 NOTE — Telephone Encounter (Signed)
Pharm faxed req for clarification of sig. I re-sent it with clearer directions.

## 2015-05-09 ENCOUNTER — Ambulatory Visit
Admission: RE | Admit: 2015-05-09 | Discharge: 2015-05-09 | Disposition: A | Discharge status: Home / Self Care | Payer: BLUE CROSS/BLUE SHIELD | Source: Ambulatory Visit

## 2015-05-09 DIAGNOSIS — Z1231 Encounter for screening mammogram for malignant neoplasm of breast: Secondary | ICD-10-CM

## 2015-07-06 ENCOUNTER — Other Ambulatory Visit: Payer: Self-pay | Admitting: Family Medicine

## 2015-07-08 NOTE — Telephone Encounter (Signed)
Paper refill requested from [facility] for Relafen. Faxed 90 day supply. Recheck prior to further refills.

## 2015-07-14 ENCOUNTER — Other Ambulatory Visit: Payer: Self-pay

## 2015-07-14 MED ORDER — NABUMETONE 750 MG PO TABS: ORAL_TABLET | ORAL | Status: DC

## 2015-08-08 ENCOUNTER — Other Ambulatory Visit: Payer: Self-pay

## 2015-08-08 MED ORDER — OMEPRAZOLE 40 MG PO CPDR: DELAYED_RELEASE_CAPSULE | ORAL | Status: DC

## 2015-08-08 NOTE — Telephone Encounter (Signed)
Paper refill request received. I refilled for 90 day supply to her mail order.

## 2015-09-21 ENCOUNTER — Other Ambulatory Visit: Payer: Self-pay | Admitting: Family Medicine

## 2015-09-21 DIAGNOSIS — M170 Bilateral primary osteoarthritis of knee: Secondary | ICD-10-CM

## 2015-09-25 ENCOUNTER — Other Ambulatory Visit: Payer: Self-pay | Admitting: Family Medicine

## 2015-09-25 DIAGNOSIS — I10 Essential (primary) hypertension: Secondary | ICD-10-CM

## 2015-09-25 NOTE — Telephone Encounter (Signed)
Rx faxed to pharmacy  

## 2015-10-17 ENCOUNTER — Other Ambulatory Visit: Payer: Self-pay | Admitting: Family Medicine

## 2015-10-17 ENCOUNTER — Other Ambulatory Visit: Payer: Self-pay | Admitting: Urgent Care

## 2016-01-31 ENCOUNTER — Other Ambulatory Visit: Payer: Self-pay | Admitting: Family Medicine

## 2016-01-31 DIAGNOSIS — I10 Essential (primary) hypertension: Secondary | ICD-10-CM

## 2016-03-01 ENCOUNTER — Other Ambulatory Visit: Payer: Self-pay | Admitting: Family Medicine

## 2016-03-01 DIAGNOSIS — I10 Essential (primary) hypertension: Secondary | ICD-10-CM

## 2016-03-04 ENCOUNTER — Other Ambulatory Visit: Payer: Self-pay | Admitting: Emergency Medicine

## 2016-03-04 DIAGNOSIS — I10 Essential (primary) hypertension: Secondary | ICD-10-CM

## 2016-03-04 MED ORDER — AMLODIPINE BESYLATE 5 MG PO TABS: 5.0000 mg | ORAL_TABLET | Freq: Every day | ORAL | 1 refills | Status: AC

## 2016-03-12 ENCOUNTER — Encounter: Payer: Self-pay | Admitting: Gastroenterology

## 2016-03-12 ENCOUNTER — Other Ambulatory Visit: Payer: Self-pay | Admitting: Physician Assistant

## 2016-03-12 DIAGNOSIS — Z1231 Encounter for screening mammogram for malignant neoplasm of breast: Secondary | ICD-10-CM

## 2016-04-23 ENCOUNTER — Encounter: Payer: Self-pay | Admitting: Physician Assistant

## 2016-05-08 ENCOUNTER — Encounter: Payer: Self-pay | Admitting: Gastroenterology

## 2016-05-13 ENCOUNTER — Ambulatory Visit
Admission: RE | Admit: 2016-05-13 | Discharge: 2016-05-13 | Disposition: A | Discharge status: Home / Self Care | Payer: BLUE CROSS/BLUE SHIELD | Source: Ambulatory Visit | Attending: Physician Assistant | Admitting: Physician Assistant

## 2016-05-13 ENCOUNTER — Ambulatory Visit: Payer: Self-pay

## 2016-05-13 DIAGNOSIS — Z1231 Encounter for screening mammogram for malignant neoplasm of breast: Secondary | ICD-10-CM

## 2016-11-08 ENCOUNTER — Encounter: Payer: Self-pay | Admitting: Gastroenterology

## 2016-11-27 ENCOUNTER — Ambulatory Visit (AMBULATORY_SURGERY_CENTER): Payer: Self-pay

## 2016-11-27 VITALS — Ht 65.0 in | Wt 255.0 lb

## 2016-11-27 DIAGNOSIS — Z1211 Encounter for screening for malignant neoplasm of colon: Secondary | ICD-10-CM

## 2016-11-27 MED ORDER — SUPREP BOWEL PREP KIT 17.5-3.13-1.6 GM/177ML PO SOLN: 1.0000 | Freq: Once | ORAL | 0 refills | Status: AC

## 2016-11-27 NOTE — Progress Notes (Signed)
No allergies to eggs or soy  Last dose of Phentermine on 11/27/16  No home oxygen No past problems with anesthesia  Declined emmi

## 2016-11-28 ENCOUNTER — Telehealth: Payer: Self-pay | Admitting: Gastroenterology

## 2016-11-28 DIAGNOSIS — Z1211 Encounter for screening for malignant neoplasm of colon: Secondary | ICD-10-CM

## 2016-11-28 NOTE — Telephone Encounter (Signed)
Patient states prep for colon on 11.1.18 is not covered by ins and needs something else called into [facility].

## 2016-11-29 MED ORDER — NA SULFATE-K SULFATE-MG SULF 17.5-3.13-1.6 GM/177ML PO SOLN: ORAL | 0 refills | Status: DC

## 2016-11-29 NOTE — Telephone Encounter (Signed)
Pt will come to [facility] 4th floor for Suprep sample today.

## 2016-12-02 ENCOUNTER — Encounter: Payer: Self-pay | Admitting: Gastroenterology

## 2016-12-12 ENCOUNTER — Ambulatory Visit (AMBULATORY_SURGERY_CENTER): Payer: BLUE CROSS/BLUE SHIELD | Admitting: Gastroenterology

## 2016-12-12 ENCOUNTER — Encounter: Payer: Self-pay | Admitting: Gastroenterology

## 2016-12-12 VITALS — BP 116/74 | HR 80 | Temp 97.3°F | Resp 14 | Ht 65.0 in | Wt 255.0 lb

## 2016-12-12 DIAGNOSIS — Z1211 Encounter for screening for malignant neoplasm of colon: Secondary | ICD-10-CM

## 2016-12-12 DIAGNOSIS — Z1212 Encounter for screening for malignant neoplasm of rectum: Secondary | ICD-10-CM

## 2016-12-12 DIAGNOSIS — D126 Benign neoplasm of colon, unspecified: Secondary | ICD-10-CM | POA: Diagnosis not present

## 2016-12-12 DIAGNOSIS — K635 Polyp of colon: Secondary | ICD-10-CM

## 2016-12-12 DIAGNOSIS — D122 Benign neoplasm of ascending colon: Secondary | ICD-10-CM

## 2016-12-12 DIAGNOSIS — D123 Benign neoplasm of transverse colon: Secondary | ICD-10-CM | POA: Diagnosis not present

## 2016-12-12 MED ORDER — SODIUM CHLORIDE 0.9 % IV SOLN: 500.0000 mL | INTRAVENOUS | Status: DC

## 2016-12-12 NOTE — Progress Notes (Signed)
Called to room to assist during endoscopic procedure.  Patient ID and intended procedure confirmed with present staff. Received instructions for my participation in the procedure from the performing physician.  

## 2016-12-12 NOTE — Patient Instructions (Signed)
Impression/Recommendations:  Polyp handout given to patient. Hemorrhoid handout given to patient.  Resume previous diet. Continue present medications.  Await pathology results.  Repeat colonoscopy recommended for surveillance.  Date to be determined after pathology results reviewed.  No Ibuprofen, Naproxen, or other NSAID drugs for 2 weeks after polyp removal.  Tylenol only until 12/26/2016.  YOU HAD AN ENDOSCOPIC PROCEDURE TODAY AT [facility]:   Refer to the procedure report that was given to you for any specific questions about what was found during the examination.  If the procedure report does not answer your questions, please call your gastroenterologist to clarify.  If you requested that your care partner not be given the details of your procedure findings, then the procedure report has been included in a sealed envelope for you to review at your convenience later.  YOU SHOULD EXPECT: Some feelings of bloating in the abdomen. Passage of more gas than usual.  Walking can help get rid of the air that was put into your GI tract during the procedure and reduce the bloating. If you had a lower endoscopy (such as a colonoscopy or flexible sigmoidoscopy) you may notice spotting of blood in your stool or on the toilet paper. If you underwent a bowel prep for your procedure, you may not have a normal bowel movement for a few days.  Please Note:  You might notice some irritation and congestion in your nose or some drainage.  This is from the oxygen used during your procedure.  There is no need for concern and it should clear up in a day or so.  SYMPTOMS TO REPORT IMMEDIATELY:   Following lower endoscopy (colonoscopy or flexible sigmoidoscopy):  Excessive amounts of blood in the stool  Significant tenderness or worsening of abdominal pains  Swelling of the abdomen that is new, acute  Fever of 100F or higher   For urgent or emergent issues, a gastroenterologist can be  reached at any hour by calling [phone].   DIET:  We do recommend a small meal at first, but then you may proceed to your regular diet.  Drink plenty of fluids but you should avoid alcoholic beverages for 24 hours.  ACTIVITY:  You should plan to take it easy for the rest of today and you should NOT DRIVE or use heavy machinery until tomorrow (because of the sedation medicines used during the test).    FOLLOW UP: Our staff will call the number listed on your records the next business day following your procedure to check on you and address any questions or concerns that you may have regarding the information given to you following your procedure. If we do not reach you, we will leave a message.  However, if you are feeling well and you are not experiencing any problems, there is no need to return our call.  We will assume that you have returned to your regular daily activities without incident.  If any biopsies were taken you will be contacted by phone or by letter within the next 1-3 weeks.  Please call us at [phone] if you have not heard about the biopsies in 3 weeks.    SIGNATURES/CONFIDENTIALITY: You and/or your care partner have signed paperwork which will be entered into your electronic medical record.  These signatures attest to the fact that that the information above on your After Visit Summary has been reviewed and is understood.  Full responsibility of the confidentiality of this discharge information lies with you and/or  your care-partner. 

## 2016-12-12 NOTE — Progress Notes (Signed)
No changes in medical or surgical hx since PV per pt 

## 2016-12-12 NOTE — Op Note (Signed)
Ideal Patient Name: Sierra Shields Procedure Date: 12/12/2016 2:50 PM MRN: [id number] Endoscopist: Remo Lipps P. Armbruster MD, MD Age: 66 Referring MD:  Date of Birth: 21-Aug-1950 Gender: Female Account #: [id number] Procedure:                Colonoscopy Indications:              Screening for colorectal malignant neoplasm Medicines:                Monitored Anesthesia Care Procedure:                Pre-Anesthesia Assessment:                           - Prior to the procedure, a History and Physical                            was performed, and patient medications and                            allergies were reviewed. The patient's tolerance of                            previous anesthesia was also reviewed. The risks                            and benefits of the procedure and the sedation                            options and risks were discussed with the patient.                            All questions were answered, and informed consent                            was obtained. Prior Anticoagulants: The patient has                            taken no previous anticoagulant or antiplatelet                            agents. ASA Grade Assessment: III - A patient with                            severe systemic disease. After reviewing the risks                            and benefits, the patient was deemed in                            satisfactory condition to undergo the procedure.                           After obtaining informed consent, the colonoscope  was passed under direct vision. Throughout the                            procedure, the patient's blood pressure, pulse, and                            oxygen saturations were monitored continuously. The                            Colonoscope was introduced through the anus and                            advanced to the the terminal ileum, with                            identification  of the appendiceal orifice and IC                            valve. The colonoscopy was performed without                            difficulty. The patient tolerated the procedure                            well. The quality of the bowel preparation was                            good. The terminal ileum, ileocecal valve,                            appendiceal orifice, and rectum were photographed. Scope In: 2:55:52 PM Scope Out: 3:15:16 PM Scope Withdrawal Time: 0 hours 17 minutes 40 seconds  Total Procedure Duration: 0 hours 19 minutes 24 seconds  Findings:                 The perianal and digital rectal examinations were                            normal.                           The terminal ileum appeared normal.                           A 3 mm polyp was found in the ascending colon. The                            polyp was sessile. The polyp was removed with a                            cold snare. Resection and retrieval were complete.                           A 4 mm polyp was found in the hepatic flexure. The  polyp was sessile. The polyp was removed with a                            cold snare. Resection and retrieval were complete.                           A 3 mm polyp was found in the transverse colon. The                            polyp was sessile. The polyp was removed with a                            cold snare. Resection and retrieval were complete.                           Internal hemorrhoids were found during                            retroflexion. The hemorrhoids were large.                           The exam was otherwise without abnormality. Complications:            No immediate complications. Estimated blood loss:                            Minimal. Estimated Blood Loss:     Estimated blood loss was minimal. Impression:               - The examined portion of the ileum was normal.                           - One 3 mm polyp in the  ascending colon, removed                            with a cold snare. Resected and retrieved.                           - One 4 mm polyp at the hepatic flexure, removed                            with a cold snare. Resected and retrieved.                           - One 3 mm polyp in the transverse colon, removed                            with a cold snare. Resected and retrieved.                           - Internal hemorrhoids.                           - The examination was otherwise normal. Recommendation:           -  Patient has a contact number available for                            emergencies. The signs and symptoms of potential                            delayed complications were discussed with the                            patient. Return to normal activities tomorrow.                            Written discharge instructions were provided to the                            patient.                           - Resume previous diet.                           - Continue present medications.                           - Await pathology results.                           - Repeat colonoscopy is recommended for                            surveillance. The colonoscopy date will be                            determined after pathology results from today's                            exam become available for review.                           - No ibuprofen, naproxen, or other non-steroidal                            anti-inflammatory drugs for 2 weeks after polyp                            removal. Remo Lipps P. Armbruster MD, MD 12/12/2016 3:19:36 PM This report has been signed electronically.

## 2016-12-12 NOTE — Progress Notes (Signed)
Report given to PACU, vss 

## 2016-12-13 ENCOUNTER — Telehealth: Payer: Self-pay

## 2016-12-13 NOTE — Telephone Encounter (Signed)
  Follow up Call-  Call back number 12/12/2016  Post procedure Call Back phone  # [phone]  Permission to leave phone message Yes  Some recent data might be hidden     Patient questions:  Do you have a fever, pain , or abdominal swelling? No. Pain Score  0 *  Have you tolerated food without any problems? Yes.    Have you been able to return to your normal activities? Yes.    Do you have any questions about your discharge instructions: Diet   No. Medications  No. Follow up visit  No.  Do you have questions or concerns about your Care? No.  Actions: * If pain score is 4 or above: No action needed, pain <4.

## 2016-12-20 ENCOUNTER — Encounter: Payer: Self-pay | Admitting: Gastroenterology

## 2017-06-23 ENCOUNTER — Other Ambulatory Visit: Payer: Self-pay | Admitting: Physician Assistant

## 2017-06-23 DIAGNOSIS — Z1231 Encounter for screening mammogram for malignant neoplasm of breast: Secondary | ICD-10-CM

## 2017-07-23 ENCOUNTER — Ambulatory Visit
Admission: RE | Admit: 2017-07-23 | Discharge: 2017-07-23 | Disposition: A | Discharge status: Home / Self Care | Payer: BLUE CROSS/BLUE SHIELD | Source: Ambulatory Visit | Attending: Physician Assistant | Admitting: Physician Assistant

## 2017-07-23 DIAGNOSIS — Z1231 Encounter for screening mammogram for malignant neoplasm of breast: Secondary | ICD-10-CM

## 2017-07-24 ENCOUNTER — Other Ambulatory Visit: Payer: Self-pay | Admitting: Physician Assistant

## 2017-07-24 DIAGNOSIS — R928 Other abnormal and inconclusive findings on diagnostic imaging of breast: Secondary | ICD-10-CM

## 2017-08-08 ENCOUNTER — Other Ambulatory Visit: Payer: Self-pay | Admitting: Physician Assistant

## 2017-08-08 DIAGNOSIS — R928 Other abnormal and inconclusive findings on diagnostic imaging of breast: Secondary | ICD-10-CM

## 2017-08-11 ENCOUNTER — Ambulatory Visit
Admission: RE | Admit: 2017-08-11 | Discharge: 2017-08-11 | Disposition: A | Discharge status: Home / Self Care | Payer: BLUE CROSS/BLUE SHIELD | Source: Ambulatory Visit | Attending: Physician Assistant | Admitting: Physician Assistant

## 2017-08-11 ENCOUNTER — Ambulatory Visit: Payer: Self-pay

## 2017-08-11 DIAGNOSIS — R928 Other abnormal and inconclusive findings on diagnostic imaging of breast: Secondary | ICD-10-CM

## 2018-07-10 ENCOUNTER — Other Ambulatory Visit: Payer: Self-pay | Admitting: Family Medicine

## 2018-07-10 DIAGNOSIS — Z1231 Encounter for screening mammogram for malignant neoplasm of breast: Secondary | ICD-10-CM

## 2018-08-13 ENCOUNTER — Other Ambulatory Visit: Payer: Self-pay | Admitting: Family Medicine

## 2018-08-13 DIAGNOSIS — E2839 Other primary ovarian failure: Secondary | ICD-10-CM

## 2018-08-27 ENCOUNTER — Ambulatory Visit
Admission: RE | Admit: 2018-08-27 | Discharge: 2018-08-27 | Disposition: A | Discharge status: Home / Self Care | Payer: Medicare HMO | Source: Ambulatory Visit | Attending: Family Medicine | Admitting: Family Medicine

## 2018-08-27 DIAGNOSIS — Z1231 Encounter for screening mammogram for malignant neoplasm of breast: Secondary | ICD-10-CM

## 2018-09-16 ENCOUNTER — Other Ambulatory Visit: Payer: Self-pay

## 2018-09-16 DIAGNOSIS — Z20822 Contact with and (suspected) exposure to covid-19: Secondary | ICD-10-CM

## 2018-09-17 LAB — NOVEL CORONAVIRUS, NAA: SARS-CoV-2, NAA: NOT DETECTED

## 2018-10-23 ENCOUNTER — Other Ambulatory Visit: Payer: Self-pay

## 2018-10-23 ENCOUNTER — Ambulatory Visit
Admission: RE | Admit: 2018-10-23 | Discharge: 2018-10-23 | Disposition: A | Discharge status: Home / Self Care | Payer: Medicare HMO | Source: Ambulatory Visit | Attending: Family Medicine | Admitting: Family Medicine

## 2018-10-23 DIAGNOSIS — E2839 Other primary ovarian failure: Secondary | ICD-10-CM

## 2019-06-14 ENCOUNTER — Ambulatory Visit (INDEPENDENT_AMBULATORY_CARE_PROVIDER_SITE_OTHER): Payer: BLUE CROSS/BLUE SHIELD | Admitting: Family Medicine

## 2019-06-28 ENCOUNTER — Ambulatory Visit (INDEPENDENT_AMBULATORY_CARE_PROVIDER_SITE_OTHER): Payer: BLUE CROSS/BLUE SHIELD | Admitting: Family Medicine

## 2019-07-23 ENCOUNTER — Other Ambulatory Visit: Payer: Self-pay | Admitting: Family Medicine

## 2019-07-23 DIAGNOSIS — Z1231 Encounter for screening mammogram for malignant neoplasm of breast: Secondary | ICD-10-CM

## 2019-08-31 ENCOUNTER — Ambulatory Visit
Admission: RE | Admit: 2019-08-31 | Discharge: 2019-08-31 | Disposition: A | Discharge status: Home / Self Care | Payer: Medicare HMO | Source: Ambulatory Visit | Attending: Family Medicine | Admitting: Family Medicine

## 2019-08-31 ENCOUNTER — Other Ambulatory Visit: Payer: Self-pay

## 2019-08-31 DIAGNOSIS — Z1231 Encounter for screening mammogram for malignant neoplasm of breast: Secondary | ICD-10-CM

## 2019-09-01 ENCOUNTER — Ambulatory Visit (INDEPENDENT_AMBULATORY_CARE_PROVIDER_SITE_OTHER): Payer: Medicare HMO

## 2019-09-01 ENCOUNTER — Ambulatory Visit: Payer: Self-pay

## 2019-09-01 ENCOUNTER — Ambulatory Visit (INDEPENDENT_AMBULATORY_CARE_PROVIDER_SITE_OTHER): Payer: Medicare HMO | Admitting: Orthopaedic Surgery

## 2019-09-01 ENCOUNTER — Encounter: Payer: Self-pay | Admitting: Orthopaedic Surgery

## 2019-09-01 VITALS — Ht 65.0 in | Wt 267.6 lb

## 2019-09-01 DIAGNOSIS — M1712 Unilateral primary osteoarthritis, left knee: Secondary | ICD-10-CM | POA: Diagnosis not present

## 2019-09-01 DIAGNOSIS — M25562 Pain in left knee: Secondary | ICD-10-CM | POA: Diagnosis not present

## 2019-09-01 DIAGNOSIS — M25561 Pain in right knee: Secondary | ICD-10-CM

## 2019-09-01 DIAGNOSIS — M1711 Unilateral primary osteoarthritis, right knee: Secondary | ICD-10-CM | POA: Diagnosis not present

## 2019-09-01 DIAGNOSIS — M170 Bilateral primary osteoarthritis of knee: Secondary | ICD-10-CM

## 2019-09-01 MED ORDER — METHYLPREDNISOLONE ACETATE 40 MG/ML IJ SUSP
40.0000 mg | INTRAMUSCULAR | Status: AC | PRN
  Administered 2019-09-01: 40 mg via INTRA_ARTICULAR

## 2019-09-01 MED ORDER — BUPIVACAINE HCL 0.5 % IJ SOLN
2.0000 mL | INTRAMUSCULAR | Status: AC | PRN
  Administered 2019-09-01: 2 mL via INTRA_ARTICULAR

## 2019-09-01 MED ORDER — LIDOCAINE HCL 1 % IJ SOLN
2.0000 mL | INTRAMUSCULAR | Status: AC | PRN
  Administered 2019-09-01: 2 mL

## 2019-09-01 NOTE — Progress Notes (Signed)
Office Visit Note   Patient: Sierra Shields           Date of Birth: [birth year]           MRN: [id number] Visit Date: 09/01/2019              Requested by: No referring provider defined for this encounter. PCP: Barton Fanny, MD (Inactive)   Assessment & Plan: Visit Diagnoses:  1. Primary osteoarthritis of right knee   2. Primary osteoarthritis of left knee     Plan: I impression is end-stage bilateral knee DJD.  Patient's BMI is 44.5 therefore I have recommended weight watchers as she is unable to exercise due to the pain and she is unable to see Dr. Leafy Ro due to insurance reasons.  Bilateral knee cortisone injections performed today.  She is allergic to nickel.  Follow-up as needed.  Follow-Up Instructions: No follow-ups on file.   Orders:  Orders Placed This Encounter  Procedures  . XR KNEE 3 VIEW LEFT  . XR KNEE 3 VIEW RIGHT   No orders of the defined types were placed in this encounter.     Procedures: Large Joint Inj: bilateral knee on 09/01/2019 3:03 PM Indications: pain Details: 22 G needle  Arthrogram: No  Medications (Right): 2 mL lidocaine 1 %; 2 mL bupivacaine 0.5 %; 40 mg methylPREDNISolone acetate 40 MG/ML Medications (Left): 2 mL lidocaine 1 %; 2 mL bupivacaine 0.5 %; 40 mg methylPREDNISolone acetate 40 MG/ML Outcome: tolerated well, no immediate complications Patient was prepped and draped in the usual sterile fashion.       Clinical Data: No additional findings.   Subjective: Chief Complaint  Patient presents with  . Right Knee - Pain  . Left Knee - Pain    Sierra Shields is a very pleasant 69 year old female comes in for bilateral knee pain for many years.  Left greater than right.  Uses Advil for pain.  Denies any injuries or previous surgeries.  Walks with a cane.  She has had cortisone injections with some temporary relief as well as Visco injections with temporary relief.  Her insurance denied her ability to see Dr. Leafy Ro for  weight loss.  She has tried phentermine as well but did not like side effects.   Review of Systems  Constitutional: Negative.   HENT: Negative.   Eyes: Negative.   Respiratory: Negative.   Cardiovascular: Negative.   Endocrine: Negative.   Musculoskeletal: Negative.   Neurological: Negative.   Hematological: Negative.   Psychiatric/Behavioral: Negative.   All other systems reviewed and are negative.    Objective: Vital Signs: Ht 5\' 5"  (1.651 m)   Wt 267 lb 9.6 oz (121.4 kg)   BMI 44.53 kg/m   Physical Exam Vitals and nursing note reviewed.  Constitutional:      Appearance: She is well-developed.  HENT:     Head: Normocephalic and atraumatic.  Pulmonary:     Effort: Pulmonary effort is normal.  Abdominal:     Palpations: Abdomen is soft.  Musculoskeletal:     Cervical back: Neck supple.  Skin:    General: Skin is warm.     Capillary Refill: Capillary refill takes less than 2 seconds.  Neurological:     Mental Status: She is alert and oriented to person, place, and time.  Psychiatric:        Behavior: Behavior normal.        Thought Content: Thought content normal.        Judgment:  Judgment normal.     Ortho Exam Bilateral knees show no joint effusions.  Painful range of motion.  No malalignments.  Moderate restriction range of motion partly due to body habitus. Specialty Comments:  No specialty comments available.  Imaging: XR KNEE 3 VIEW LEFT  Result Date: 09/01/2019 Severe tricompartmental DJD  XR KNEE 3 VIEW RIGHT  Result Date: 09/01/2019 Severe tricompartment DJD    PMFS History: Patient Active Problem List   Diagnosis Date Noted  . Primary osteoarthritis of right knee 09/01/2019  . Primary osteoarthritis of left knee 09/01/2019  . Pre-diabetes 03/06/2015  . Obesity, Class III, BMI 40-49.9 (morbid obesity) ([place]) 04/04/2011  . Left knee DJD 04/04/2011  . Right knee DJD 04/04/2011  . Hypertension   . Anemia   . GERD (gastroesophageal reflux  disease)   . Allergic rhinitis    Past Medical History:  Diagnosis Date  . Allergic rhinitis   . Anemia   . Arthritis   . GERD (gastroesophageal reflux disease)   . Hypertension     Family History  Problem Relation Age of Onset  . Diabetes Mother   . Hypertension Mother   . Heart disease Mother   . Stroke Mother   . Diabetes Father   . Heart disease Father   . Hyperlipidemia Father   . Hypertension Father   . Heart failure Sister   . Heart disease Maternal Grandmother   . Hypertension Maternal Grandmother   . Hyperlipidemia Maternal Grandfather   . Heart disease Paternal Grandfather   . Stroke Paternal Grandfather   . Hypertension Other   . Colon cancer Neg Hx   . Esophageal cancer Neg Hx   . Stomach cancer Neg Hx   . Rectal cancer Neg Hx     Past Surgical History:  Procedure Laterality Date  . APPENDECTOMY    . CHOLECYSTECTOMY    . COLONOSCOPY    . DENTAL RESTORATION/EXTRACTION WITH X-RAY    . REPAIR PERONEAL TENDONS ANKLE     left  . TOTAL ABDOMINAL HYSTERECTOMY  06/2001  . UPPER GASTROINTESTINAL ENDOSCOPY     Social History   Occupational History  . Occupation: Astronomer  Tobacco Use  . Smoking status: Never Smoker  . Smokeless tobacco: Never Used  Vaping Use  . Vaping Use: Never used  Substance and Sexual Activity  . Alcohol use: No    Alcohol/week: 0.0 standard drinks    Comment: special occasions-rare  . Drug use: No  . Sexual activity: Yes    Birth control/protection: None

## 2020-03-29 ENCOUNTER — Encounter: Payer: Self-pay | Admitting: Gastroenterology

## 2020-07-28 ENCOUNTER — Other Ambulatory Visit: Payer: Self-pay | Admitting: Family Medicine

## 2020-07-28 DIAGNOSIS — Z1231 Encounter for screening mammogram for malignant neoplasm of breast: Secondary | ICD-10-CM

## 2020-09-21 ENCOUNTER — Ambulatory Visit: Payer: Medicare HMO

## 2020-09-22 ENCOUNTER — Ambulatory Visit: Payer: Medicare HMO

## 2020-10-03 ENCOUNTER — Other Ambulatory Visit: Payer: Self-pay

## 2020-10-03 ENCOUNTER — Ambulatory Visit
Admission: RE | Admit: 2020-10-03 | Discharge: 2020-10-03 | Disposition: A | Discharge status: Home / Self Care | Payer: Medicare HMO | Source: Ambulatory Visit | Attending: Family Medicine | Admitting: Family Medicine

## 2020-10-03 DIAGNOSIS — Z1231 Encounter for screening mammogram for malignant neoplasm of breast: Secondary | ICD-10-CM

## 2020-11-23 ENCOUNTER — Ambulatory Visit: Payer: Medicare HMO | Admitting: Sports Medicine

## 2021-03-05 ENCOUNTER — Other Ambulatory Visit: Payer: Self-pay

## 2021-03-05 ENCOUNTER — Emergency Department (HOSPITAL_COMMUNITY)
Admission: EM | Admit: 2021-03-05 | Discharge: 2021-03-05 | Disposition: A | Discharge status: Home / Self Care | Payer: Medicare HMO | Attending: Emergency Medicine | Admitting: Emergency Medicine

## 2021-03-05 ENCOUNTER — Encounter (HOSPITAL_COMMUNITY): Payer: Self-pay

## 2021-03-05 ENCOUNTER — Ambulatory Visit: Payer: Medicare HMO | Admitting: Podiatry

## 2021-03-05 DIAGNOSIS — Z79899 Other long term (current) drug therapy: Secondary | ICD-10-CM | POA: Diagnosis not present

## 2021-03-05 DIAGNOSIS — B351 Tinea unguium: Secondary | ICD-10-CM | POA: Diagnosis not present

## 2021-03-05 DIAGNOSIS — L600 Ingrowing nail: Secondary | ICD-10-CM | POA: Diagnosis not present

## 2021-03-05 DIAGNOSIS — H9201 Otalgia, right ear: Secondary | ICD-10-CM | POA: Diagnosis present

## 2021-03-05 DIAGNOSIS — H65191 Other acute nonsuppurative otitis media, right ear: Secondary | ICD-10-CM | POA: Insufficient documentation

## 2021-03-05 DIAGNOSIS — L603 Nail dystrophy: Secondary | ICD-10-CM | POA: Diagnosis not present

## 2021-03-05 DIAGNOSIS — Z7982 Long term (current) use of aspirin: Secondary | ICD-10-CM | POA: Diagnosis not present

## 2021-03-05 DIAGNOSIS — I10 Essential (primary) hypertension: Secondary | ICD-10-CM | POA: Insufficient documentation

## 2021-03-05 DIAGNOSIS — Z91040 Latex allergy status: Secondary | ICD-10-CM | POA: Diagnosis not present

## 2021-03-05 NOTE — Discharge Instructions (Signed)
Fluid in your inner ear,  recommend over-the-counter pain medications like ibuprofen Tylenol for fever and pain control, nasal decongestions like Flonase and Zyrtec.   Please follow with your PCP and/or ENT for further evaluation  Come back to the emergency department if symptoms worsen you develop chest pain, shortness of breath, severe abdominal pain, uncontrolled nausea, vomiting, diarrhea.

## 2021-03-05 NOTE — ED Provider Notes (Signed)
[facility] EMERGENCY DEPARTMENT Provider Note   CSN: [id number] Arrival date & time: 03/05/21  0124     History  Chief Complaint  Patient presents with   Otalgia    Sierra Shields is a 71 y.o. female.  HPI  Patient with medical history including hypertension, GERD presents with complaints of right-sided ear pain started on Friday, came on suddenly, states pain is constant, dull-like sensation, denies  drainage/discharge from ear, no decrease in hearing, denies any trauma the area, denies any dizziness headaches change in vision paresthesia weakness upper lower extremities, no fevers or chills.  States she is has in the past will generally go away on its own, she tried some Tylenol without much relief.  Home Medications Prior to Admission medications   Medication Sig Start Date End Date Taking? Authorizing Provider  allopurinol (ZYLOPRIM) 100 MG tablet Take 100 mg by mouth daily. GOUT    [provider]  amLODipine (NORVASC) 5 MG tablet Take 1 tablet (5 mg total) by mouth daily. 03/04/16   Copland, Gay Filler, MD  aspirin 81 MG tablet Take 81 mg by mouth every morning.     [provider]  BLACK COHOSH PO Take by mouth.    [provider]  cyanocobalamin 100 MCG tablet Take by mouth.    [provider]  glucosamine-chondroitin 500-400 MG tablet Take 2 tablets by mouth every morning.    [provider]  ibuprofen (ADVIL) 200 MG tablet Take by mouth.    [provider]  lisinopril-hydrochlorothiazide (PRINZIDE,ZESTORETIC) 20-12.5 MG tablet TAKE 1 BY MOUTH IN THE MORNING 09/26/15   Weber, Damaris Hippo, PA-C  metoprolol (LOPRESSOR) 50 MG tablet TAKE 1 BY MOUTH TWICE DAILY 09/26/15   Weber, Damaris Hippo, PA-C  Multiple Vitamins-Minerals (MULTIVITAMIN WOMEN 50+) TABS Take 1 tablet by mouth daily.    [provider]  omeprazole (PRILOSEC) 40 MG capsule TAKE 1 BY MOUTH DAILY 30 MINUTES BEFORE DINNER 10/19/15   Wardell Honour,  MD  potassium chloride (KLOR-CON M10) 10 MEQ tablet Take 1 tablet (10 mEq total) by mouth daily. 04/20/15   Jaynee Eagles, PA-C  potassium chloride (KLOR-CON) 10 MEQ tablet Take 1 tablet by mouth daily. 06/08/19   [provider]  traMADol (ULTRAM) 50 MG tablet TAKE 1 TABLET BY MOUTH EVERY 8 HOURS AS NEEDED 09/23/15   Debbrah Alar, NP      Allergies    Ampicillin, Nickel, Penicillins, and Latex    Review of Systems   Review of Systems  Constitutional:  Negative for chills and fever.  HENT:  Positive for ear pain. Negative for congestion.   Respiratory:  Negative for shortness of breath.   Cardiovascular:  Negative for chest pain.  Gastrointestinal:  Negative for abdominal pain.  Neurological:  Negative for headaches.   Physical Exam Updated Vital Signs BP (!) 144/87 (BP Location: Right Arm)    Pulse 85    Temp 98.4 F (36.9 C) (Oral)    Resp 19    Ht 5\' 5"  (1.651 m)    Wt 122 kg    SpO2 100%    BMI 44.76 kg/m  Physical Exam Vitals and nursing note reviewed.  Constitutional:      General: She is not in acute distress.    Appearance: She is not ill-appearing.  HENT:     Head: Normocephalic and atraumatic.     Right Ear: Tympanic membrane, ear canal and external ear normal.     Left Ear:  Tympanic membrane, ear canal and external ear normal.     Ears:     Comments: No noted ear protrusion, slightly tender behind r. mastoid, no overlying skin changes, no erythema or edema present within the  r ear canal, right TM was clear, noted fluid level , nonbulging, no erythema or edema present.    Nose: No congestion.     Mouth/Throat:     Mouth: Mucous membranes are moist.     Pharynx: Oropharynx is clear. No oropharyngeal exudate or posterior oropharyngeal erythema.  Eyes:     Conjunctiva/sclera: Conjunctivae normal.  Cardiovascular:     Rate and Rhythm: Normal rate and regular rhythm.     Pulses: Normal pulses.     Heart sounds: No murmur heard.   No friction rub. No gallop.   Pulmonary:     Effort: No respiratory distress.     Breath sounds: No wheezing, rhonchi or rales.  Skin:    General: Skin is warm and dry.  Neurological:     Mental Status: She is alert.  Psychiatric:        Mood and Affect: Mood normal.    ED Results / Procedures / Treatments   Labs (all labs ordered are listed, but only abnormal results are displayed) Labs Reviewed - No data to display  EKG None  Radiology No results found.  Procedures Procedures    Medications Ordered in ED Medications - No data to display  ED Course/ Medical Decision Making/ A&P                           Medical Decision Making  This patient presents to the ED for concern of right ear pain, this involves an extensive number of treatment options, and is a complaint that carries with it a high risk of complications and morbidity.  The differential diagnosis includes otitis media/externa, mastoiditis    Additional history obtained:  Additional history obtained from electronic medical record   Co morbidities that complicate the patient evaluation  N/A  Social Determinants of Health:  N/A    Lab Tests:  I Ordered, and personally interpreted labs.  The pertinent results include: N/A   Imaging Studies ordered:  I ordered imaging studies including N/A    Rule out Low suspicion for otitis media or externa edges no ear drainage, no erythema or edema present within the ear canal, TMs were PERRL, nonbulging, no erythema or edema present.  Low suspicion for mastoiditis as there is no ear protrusion no middle ear infection, no skin changes noted on patient's mastoids, she is slightly tender but I suspect this like secondary fluid within the inner ear.      Dispostion and problem list  After consideration of the diagnostic results and the patients response to treatment, I feel that the patent would benefit from discharge  1.  Right ear pain-likely secondary to inner ear effusion will  provide her with Flonase, Zyrtec, follow-up with ENT and/or PCP for further evaluation.            Final Clinical Impression(s) / ED Diagnoses Final diagnoses:  Acute effusion of right ear    Rx / DC Orders ED Discharge Orders     None         Aron Baba 81/27/51 [id number]    Delora Fuel, MD 74/94/49 [phone]

## 2021-03-05 NOTE — ED Triage Notes (Signed)
Pt arrives POV for eval of R sided ear pain onset yesterday. Denies fevers/chills. States has tried ibuprofen at home w/ no relief

## 2021-03-08 NOTE — Progress Notes (Signed)
Subjective:   Patient ID: Sierra Shields, female   DOB: 71 y.o.   MRN: [id number]   HPI 71 year old female presents the office today for concerns of her right foot toenails.  She said about 2 years ago she started noticing discoloration.  She previously was on medication for this, oral medication.  She states that did help the nails were not as dark but is come back.  She states that both of her big toenails become ingrown causing discomfort.  Currently no drainage or pus.  Also she was not sure if she had gout in her right big toe but she was thinking this at the nail started become more painful.  She is asking the nails be trimmed today.  Denies any swelling or redness or any drainage.  No other concerns today.  No open lesions.   Review of Systems  All other systems reviewed and are negative.  Past Medical History:  Diagnosis Date   Allergic rhinitis    Anemia    Arthritis    GERD (gastroesophageal reflux disease)    Hypertension     Past Surgical History:  Procedure Laterality Date   APPENDECTOMY     CHOLECYSTECTOMY     COLONOSCOPY     DENTAL RESTORATION/EXTRACTION WITH X-RAY     REPAIR PERONEAL TENDONS ANKLE     left   TOTAL ABDOMINAL HYSTERECTOMY  06/2001   UPPER GASTROINTESTINAL ENDOSCOPY       Current Outpatient Medications:    allopurinol (ZYLOPRIM) 100 MG tablet, Take 100 mg by mouth daily. GOUT, Disp: , Rfl:    amLODipine (NORVASC) 5 MG tablet, Take 1 tablet (5 mg total) by mouth daily., Disp: 90 tablet, Rfl: 1   aspirin 81 MG tablet, Take 81 mg by mouth every morning. , Disp: , Rfl:    BLACK COHOSH PO, Take by mouth., Disp: , Rfl:    cyanocobalamin 100 MCG tablet, Take by mouth., Disp: , Rfl:    glucosamine-chondroitin 500-400 MG tablet, Take 2 tablets by mouth every morning., Disp: , Rfl:    ibuprofen (ADVIL) 200 MG tablet, Take by mouth., Disp: , Rfl:    lisinopril-hydrochlorothiazide (PRINZIDE,ZESTORETIC) 20-12.5 MG tablet, TAKE 1 BY MOUTH IN THE MORNING, Disp:  90 tablet, Rfl: 0   metoprolol (LOPRESSOR) 50 MG tablet, TAKE 1 BY MOUTH TWICE DAILY, Disp: 180 tablet, Rfl: 0   Multiple Vitamins-Minerals (MULTIVITAMIN WOMEN 50+) TABS, Take 1 tablet by mouth daily., Disp: , Rfl:    omeprazole (PRILOSEC) 40 MG capsule, TAKE 1 BY MOUTH DAILY 30 MINUTES BEFORE DINNER, Disp: 30 capsule, Rfl: 0   potassium chloride (KLOR-CON M10) 10 MEQ tablet, Take 1 tablet (10 mEq total) by mouth daily., Disp: 90 tablet, Rfl: 2   potassium chloride (KLOR-CON) 10 MEQ tablet, Take 1 tablet by mouth daily., Disp: , Rfl:    traMADol (ULTRAM) 50 MG tablet, TAKE 1 TABLET BY MOUTH EVERY 8 HOURS AS NEEDED, Disp: 40 tablet, Rfl: 0  Allergies  Allergen Reactions   Ampicillin Other (See Comments)    Causes blisters   Nickel    Penicillins Hives   Latex Rash          Objective:  Physical Exam  General: AAO x3, NAD  Dermatological: Nails are mildly hypertrophic and dystrophic with brown, dark discoloration.  No extension of any hyperpigmentation of the surrounding skin.  There is no edema, erythema or signs of infection.  Mild incurvation of the hallux toenails without any signs of infection.  Vascular: Dorsalis  Pedis artery and Posterior Tibial artery pedal pulses are 2/4 bilateral with immedate capillary fill time. There is no pain with calf compression, swelling, warmth, erythema.   Neruologic: Grossly intact via light touch bilateral.    Musculoskeletal: Mild tenderness of ingrown toenails no other areas of discomfort.  Muscular strength 5/5 in all groups tested bilateral.  Gait: Unassisted, Nonantalgic.       Assessment:   Ingrown toenails, onychodystrophy/onychomycosis     Plan:  -Treatment options discussed including all alternatives, risks, and complications -Etiology of symptoms were discussed -Additional debrided nails x10 without any complications or bleeding as a courtesy.  Particular I sent the hallux toenails for culture, pathology to [facility] for evaluation  given the discoloration.  This seems more fungus and has oral Lamisil was beneficial for her and the color improvement would like to get the results back on this before pursuing further treatment.  In regards to ingrown toenails I discussed partial nail avulsions.  We held off on this today except for the nails with any complications or bleeding.  She will consider this if symptoms continue or worsen.  Trula Slade DPM

## 2021-03-16 ENCOUNTER — Encounter: Payer: Self-pay | Admitting: Podiatry

## 2021-03-26 ENCOUNTER — Telehealth: Payer: Self-pay | Admitting: *Deleted

## 2021-03-26 NOTE — Telephone Encounter (Signed)
Patient is calling to discuss lab results, please advise.

## 2021-03-27 NOTE — Telephone Encounter (Signed)
Called patient and spoke with grandson, asked that she call back for her lab results.

## 2021-03-29 NOTE — Telephone Encounter (Signed)
Called patient for 2nd attempt, spoke with grandson, said that he gave her the message and she said that she will call us back.

## 2021-04-03 NOTE — Telephone Encounter (Signed)
Patient called for her results,was given the results and her options/recommendations, she does not want to make a decision until she finds out the cost for each treatment. Will send to Vickie to call patient, requesting a call in mornings, works in afternoons.

## 2021-04-05 NOTE — Telephone Encounter (Signed)
Called patient giving Sierra Shields information, she said that she has reached out to her pcp,having blood work there, will stick with them for now.

## 2021-08-20 ENCOUNTER — Other Ambulatory Visit: Payer: Self-pay | Admitting: Family Medicine

## 2021-08-20 DIAGNOSIS — Z1231 Encounter for screening mammogram for malignant neoplasm of breast: Secondary | ICD-10-CM

## 2021-09-19 ENCOUNTER — Encounter (INDEPENDENT_AMBULATORY_CARE_PROVIDER_SITE_OTHER): Payer: Self-pay

## 2021-10-04 ENCOUNTER — Ambulatory Visit
Admission: RE | Admit: 2021-10-04 | Discharge: 2021-10-04 | Disposition: A | Discharge status: Home / Self Care | Payer: Medicare HMO | Source: Ambulatory Visit | Attending: Family Medicine | Admitting: Family Medicine

## 2021-10-04 DIAGNOSIS — Z1231 Encounter for screening mammogram for malignant neoplasm of breast: Secondary | ICD-10-CM

## 2022-09-09 ENCOUNTER — Other Ambulatory Visit: Payer: Self-pay | Admitting: Family Medicine

## 2022-09-09 DIAGNOSIS — Z1231 Encounter for screening mammogram for malignant neoplasm of breast: Secondary | ICD-10-CM

## 2022-10-09 ENCOUNTER — Ambulatory Visit
Admission: RE | Admit: 2022-10-09 | Discharge: 2022-10-09 | Disposition: A | Discharge status: Home / Self Care | Payer: Medicare HMO | Source: Ambulatory Visit | Attending: Family Medicine | Admitting: Family Medicine

## 2022-10-09 DIAGNOSIS — Z1231 Encounter for screening mammogram for malignant neoplasm of breast: Secondary | ICD-10-CM

## 2023-08-04 IMAGING — MG MM DIGITAL SCREENING BILAT W/ TOMO AND CAD
8 series · 8 of 24 positions shown · non-contrast
Comparison: Previous exam(s).

CLINICAL DATA: Screening.

EXAM:
DIGITAL SCREENING BILATERAL MAMMOGRAM WITH TOMOSYNTHESIS AND CAD
TECHNIQUE: Bilateral screening digital craniocaudal and mediolateral oblique
mammograms were obtained. Bilateral screening digital breast
tomosynthesis was performed. The images were evaluated with
computer-aided detection.

[R CC synth-2D]
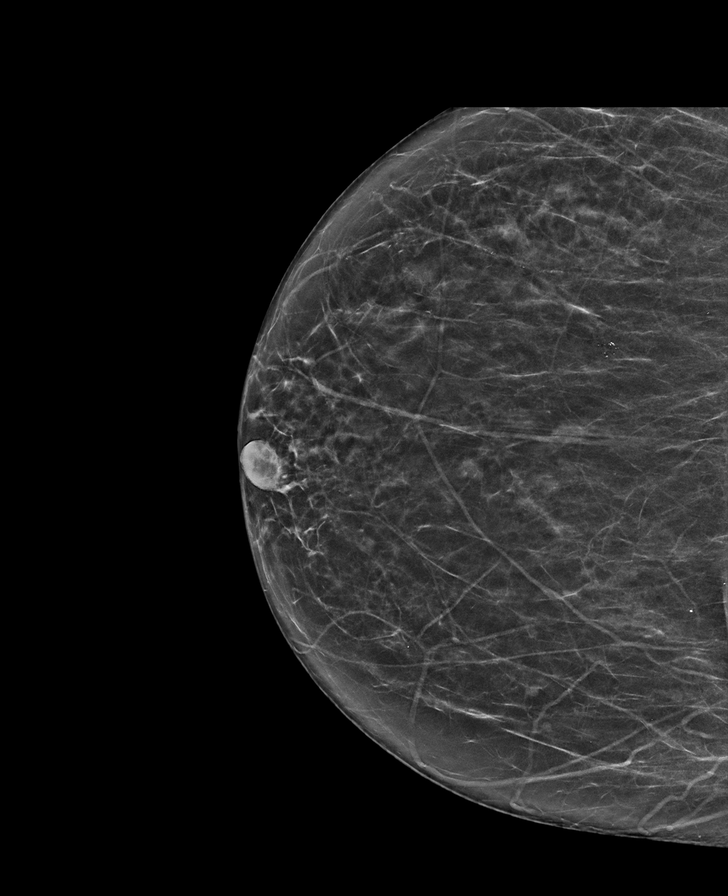

[R MLO synth-2D]
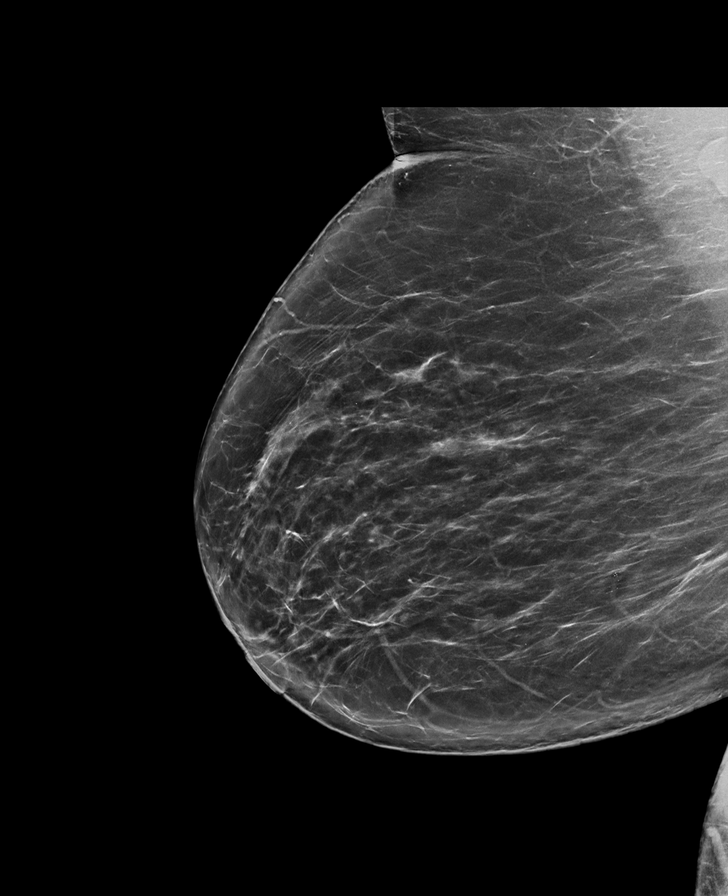

[L CC synth-2D]
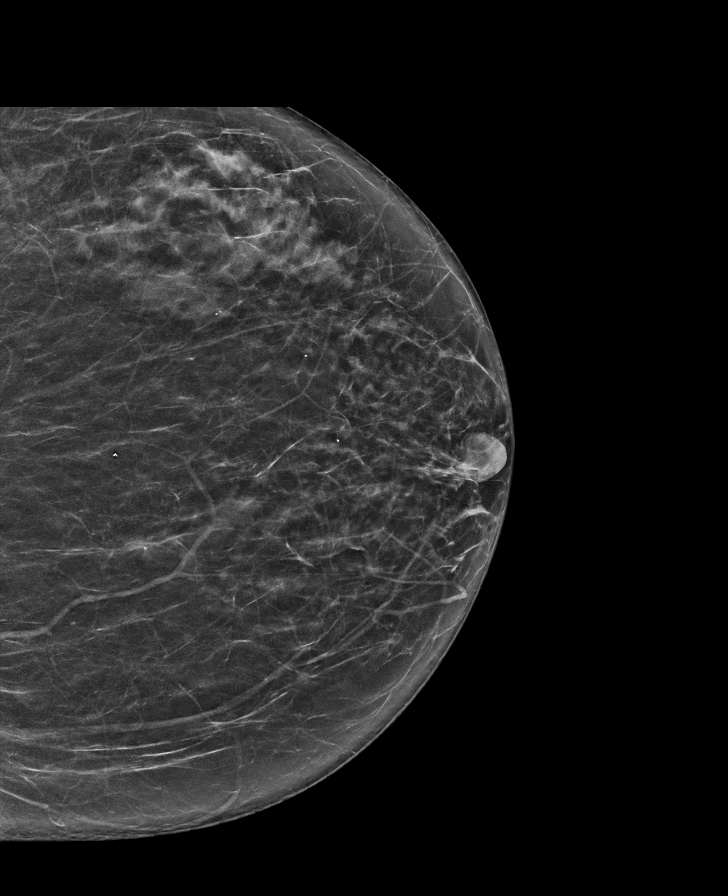

[L MLO synth-2D]
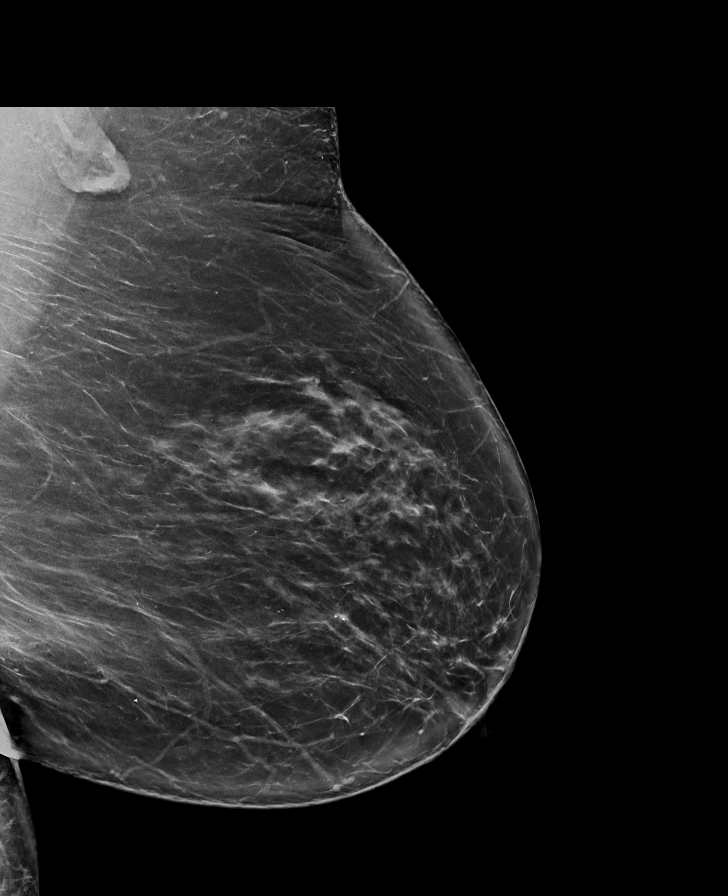

[R MLO tomo · tomo slice 44/87.0]
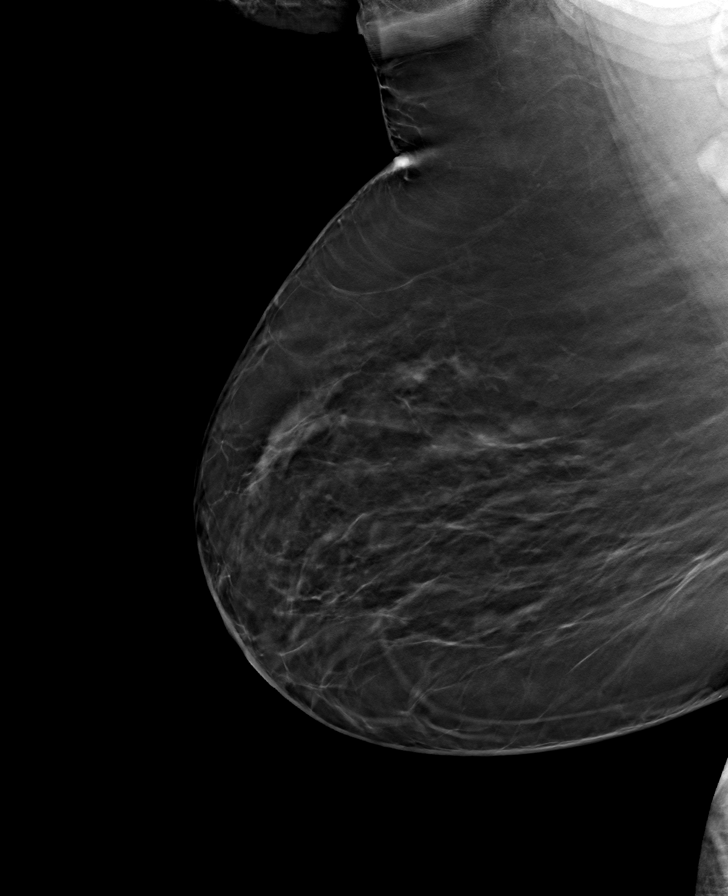

[L MLO tomo · tomo slice 45/90.0]
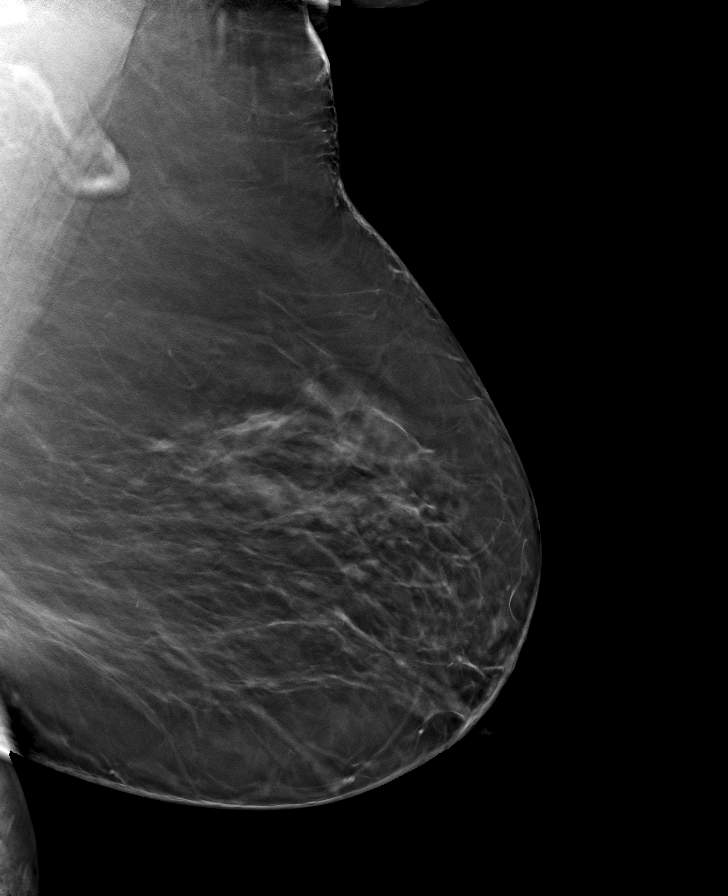

[R CC tomo · tomo slice 31/62.0]
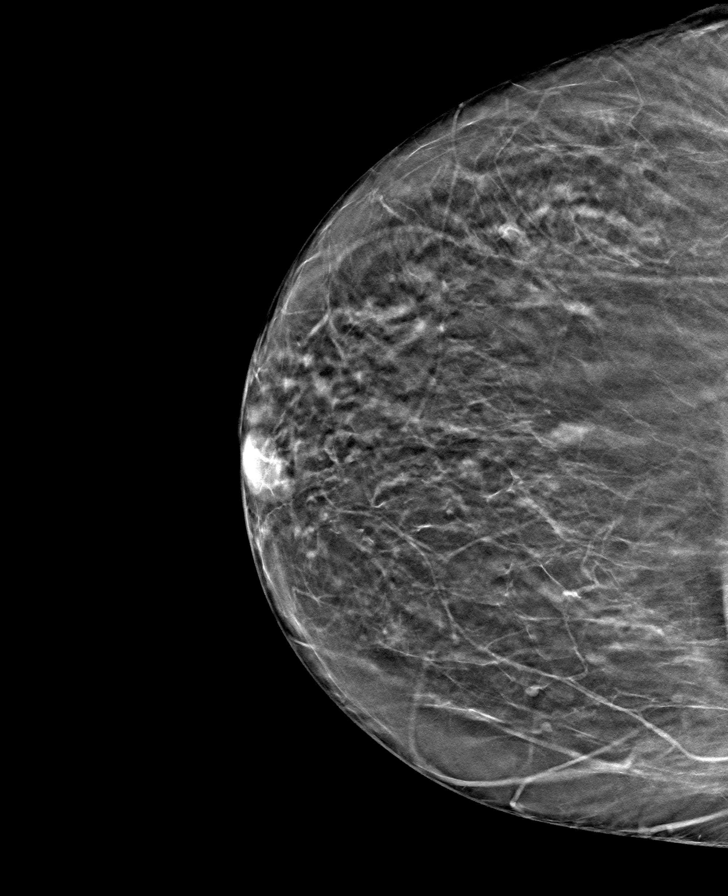

[L CC tomo · tomo slice 33/66.0]
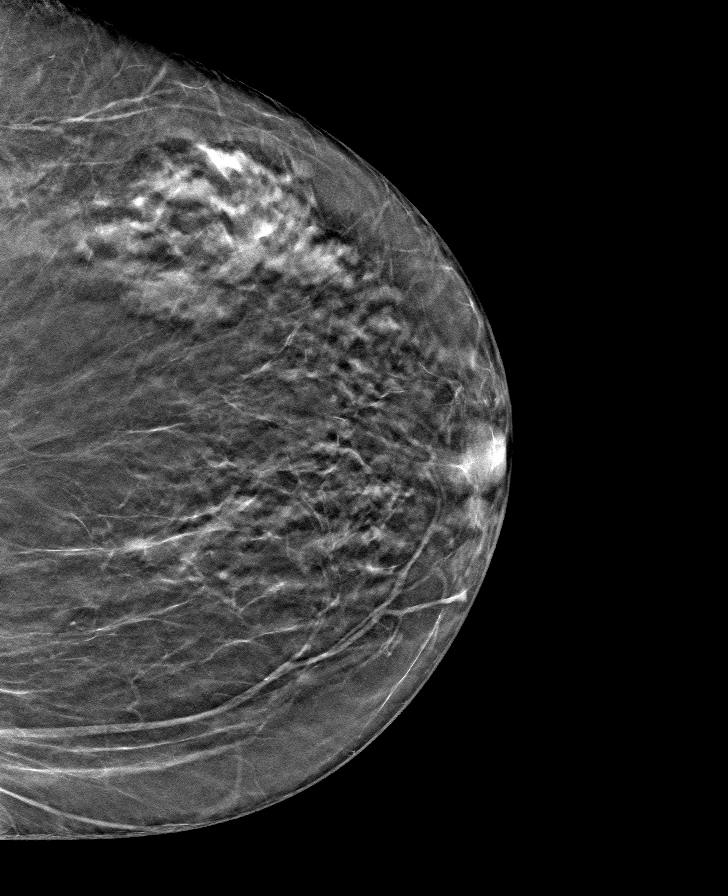

[8 of 24 positions shown; findings below may reference images not displayed]

ACR Breast Density Category b: There are scattered areas of
fibroglandular density.
FINDINGS: There are no findings suspicious for malignancy.
IMPRESSION: No mammographic evidence of malignancy. A result letter of this
screening mammogram will be mailed directly to the patient.

RECOMMENDATION:
Screening mammogram in one year. (Code:51-O-LD2)

BI-RADS CATEGORY  1: Negative.

## 2023-08-12 ENCOUNTER — Ambulatory Visit: Admitting: Podiatry

## 2023-08-12 DIAGNOSIS — L600 Ingrowing nail: Secondary | ICD-10-CM

## 2023-08-12 NOTE — Patient Instructions (Signed)

## 2023-08-12 NOTE — Progress Notes (Addendum)
 Patient complains of painful ingrown both border(s) toe 1 right. Patient denies fevers, chills, nausea, vomiting.  Objective:  Vitals: Reviewed  General: Well developed, nourished, in no acute distress, alert and oriented x3   Vascular: DP pulse 2/4 bilateral. PT pulse 2/4 bilateral.  Some edema hallux right  Dermatology: Erythema, edema, incurvated nail border both with no drainage . Tenderness present with palpation. Normal skin tone and texture feet with normal hair growth.  Neurological: Grossly intact. Normal reflexes.   Musculoskeletal: Tenderness with palpation of the distal hallux right. No tenderness or painful ROM at IPJ.  Diagnosis: Ingrown nail both borders hallux right  Plan: -discussed etiology and treatment of ingrown nails. Discussed surgical vs conservative treatment. -Consent signed for appropriate matrixectomy affected nail(s).  Procedure(s):   - Matrixectomy(s) both borders hallux right: Toe anesthetized with 3cc 2:1 mixture 2% Lidocaine  with epinephrine: Sodium Bicarbonate. Surgical site prepped. Digital tourniquet applied.  Avulsion of borders. performed. Matrixecomy performed with three 30 second applications of phenol to nail matrix. Site irrigated with alcohol.  Tourniquet released with good vascularity noticed in digit.  Applied triple antibiotic to nailbed and applied gauze and Coban dressing. - Written and oral postoperative instructions given.  -Return for post-op 2 weeks.  And start Lamisil  J Andrew Di Jasmer, DPM

## 2023-08-20 ENCOUNTER — Other Ambulatory Visit: Payer: Self-pay | Admitting: Family Medicine

## 2023-08-20 DIAGNOSIS — Z1231 Encounter for screening mammogram for malignant neoplasm of breast: Secondary | ICD-10-CM

## 2023-08-21 ENCOUNTER — Telehealth: Payer: Self-pay | Admitting: Podiatry

## 2023-08-21 NOTE — Telephone Encounter (Signed)
 Patient had toe nail removed at last appointment.A blister has developed on bottom of the toe and toe is numb. Is this normal?

## 2023-08-22 NOTE — Telephone Encounter (Signed)
 Called patient to let her know Dr. Diona response to her questions.

## 2023-08-25 ENCOUNTER — Ambulatory Visit (INDEPENDENT_AMBULATORY_CARE_PROVIDER_SITE_OTHER)

## 2023-08-25 ENCOUNTER — Ambulatory Visit: Admitting: Podiatry

## 2023-08-25 DIAGNOSIS — M899 Disorder of bone, unspecified: Secondary | ICD-10-CM | POA: Diagnosis not present

## 2023-08-25 DIAGNOSIS — L97511 Non-pressure chronic ulcer of other part of right foot limited to breakdown of skin: Secondary | ICD-10-CM

## 2023-08-25 MED ORDER — MUPIROCIN 2 % EX OINT: 1.0000 | TOPICAL_OINTMENT | Freq: Two times a day (BID) | CUTANEOUS | 0 refills | Status: AC

## 2023-08-25 MED ORDER — SULFAMETHOXAZOLE-TRIMETHOPRIM 800-160 MG PO TABS: 1.0000 | ORAL_TABLET | Freq: Two times a day (BID) | ORAL | 0 refills | Status: AC

## 2023-08-25 NOTE — Progress Notes (Signed)
 Patient presents slightly over 2 weeks status post nail surgery.  She was doing well and started getting drainage and swelling in the toe.  Noticed a wound developing on the medial aspect of the great toe.  No F/C or N/V.   Physical exam:  General appearance: Pleasant, and in no acute distress. AOx3.  Vascular: Pedal pulses: DP 2/4 bilaterally, PT 2/4 bilaterally.  Moderate edema lower legs bilaterally. Capillary fill time  immediately bilaterally.  Neurological: Light touch intact feet bilaterally.  Normal Achilles reflex bilaterally.  No clonus or spasticity noted.  Paresthesias hallux right  Dermatologic:   Partial thickness ulcerating penetrating the dermis medial aspect IPJ of hallux right.  No signs of infection.  Blistering along the proximal nail fold with clear fluid.  Blistering of proximal nail fold.  Skin normal temperature bilaterally.  Skin normal color, tone, and texture bilaterally.  Hallux is erythematous.  Musculoskeletal:    Radiographs: 3 views right foot: Severe exostosis distal phalanx hallux right.  No assenting bone infection with good cortical margins and no radiolucency noted.  No assenting bone tumors.  No assenting fractures  Diagnosis: 1.  Superficial ulcer right great toe.  Plan: -Discussed wound and ulcer on foot.  Probably has a combination of cellulitis and hyper inflammatory phenol reaction. Wound care: Soak foot warm salt Epsom salt water 15 minutes twice daily, apply Bactroban  ointment, apply light dressing -Rx Bactroban  ointment, apply twice daily after soaks to wound, 1 refill - Rx Septra  DS 1 p.o. twice daily for 10 days Sharp debridement superficial ulceration medial hallux right.  Debrided any devitalized tissue to good bleeding base.  Applied antibiotic ointment and a light dressing.   Return scheduled appointment next week

## 2023-08-25 NOTE — Patient Instructions (Signed)

## 2023-08-28 ENCOUNTER — Telehealth: Payer: Self-pay | Admitting: Podiatry

## 2023-08-28 NOTE — Telephone Encounter (Signed)
 Patient called in regards to procedure site. She's requesting a call back. She states the area is still numb and its still dark in color but has lightened since the procedure. She says she's not happy with the way her toe looks and states she has been following the aftercare instructions provided by the office. Thanks

## 2023-09-02 ENCOUNTER — Ambulatory Visit (INDEPENDENT_AMBULATORY_CARE_PROVIDER_SITE_OTHER): Admitting: Podiatry

## 2023-09-02 ENCOUNTER — Encounter: Payer: Self-pay | Admitting: Podiatry

## 2023-09-02 VITALS — Ht 65.0 in | Wt 268.0 lb

## 2023-09-02 DIAGNOSIS — L97511 Non-pressure chronic ulcer of other part of right foot limited to breakdown of skin: Secondary | ICD-10-CM

## 2023-09-02 NOTE — Progress Notes (Signed)
 Patient presents today for follow-up severe hyper reaction to phenol.  Doing much better toe is still numb but not having any pain.  Much less drainage than before.  Redness is gone down.  Physical Exam:  Patient alert and oriented x 3.  No complaints of nausea, vomiting, fever, or chills  Vascular: DP pulses 2/4 bilateral. PT pulses 2/4 lateral.  Decreased edema hallux right. Capillary fill time immediate right.  Dermatologic: Superficial ulcerating penetrating the dermis medial aspect hallux right with mild clear drainage and no signs of infection.  Inflammation of the hallux is considerably decreased. measures 9 mm wide x 4 mm long x 2 deep.   Neurologic:   Musculoskeletal:    Diagnoses: 1.  Superficial ulcer penetrating the dermis Wagner grade 1 hallux right.-Improved  Plan: -Continue wound care soaking twice daily warm Epsom salt water, applying Bactroban  ointment, and a light dressing. -Sharp debridement superficial ulceration into the dermis right great toe.  Sharply debriding any devitalized tissue to a good bleeding base.  Applied dry antibiotic ointment and a light dressing.    Return 2 weeks f/u ulcer right

## 2023-09-16 ENCOUNTER — Ambulatory Visit: Admitting: Podiatry

## 2023-09-23 ENCOUNTER — Encounter: Payer: Self-pay | Admitting: Podiatry

## 2023-09-23 ENCOUNTER — Ambulatory Visit: Admitting: Podiatry

## 2023-09-23 DIAGNOSIS — L97512 Non-pressure chronic ulcer of other part of right foot with fat layer exposed: Secondary | ICD-10-CM

## 2023-09-23 NOTE — Progress Notes (Signed)
 Patient follows up for ulcer on the hallux right.  She says she has been she is doing wound care.  No fever chills or nausea or vomiting.  Physical Exam:  Patient alert and oriented x 3.  No complaints of nausea, vomiting, fever, or chills  Vascular: DP pulses 2/4 bilateral. PT pulses 2/4 lateral.  Mild edema hallux right. Capillary fill time immediate 1 through 5 right.  Dermatologic: Full-thickness ulceration plantar medial aspect hallux of right.  Moderate clear drainage.  No signs of infection.  Granulation tissue present.  Ulcer  . Measures 5mm wide x 1 mm long x 3mm deep.  No undermining of ulcer.  Neurologic:   Musculoskeletal: Hallux abductovalgus deformity right.    Diagnoses: 1.  Full-thickness Wagner grade 2 ulceration hallux right.  Plan: -Sharp debridement full-thickness ulceration plantar lateral aspect hallux right.  Sharply debriding devitalized tissue and subcutaneous tissue.  Debrided to a good bleeding base.  Applied dressing with along with some antibiotic ointment. - Continue wound care soaking twice daily warm salt water, apply Bactroban  ointment and a light dressing.   -Patient declined surgical shoe on the right.  I explained her that this would help offload the area and the left to heal faster.    Return 2 weeks f/u ulcer hallux right

## 2023-10-14 ENCOUNTER — Ambulatory Visit: Admitting: Podiatry

## 2023-10-14 ENCOUNTER — Encounter: Payer: Self-pay | Admitting: Podiatry

## 2023-10-14 VITALS — Ht 65.0 in | Wt 268.0 lb

## 2023-10-14 DIAGNOSIS — M79671 Pain in right foot: Secondary | ICD-10-CM

## 2023-10-14 DIAGNOSIS — M79672 Pain in left foot: Secondary | ICD-10-CM

## 2023-10-14 DIAGNOSIS — B351 Tinea unguium: Secondary | ICD-10-CM | POA: Diagnosis not present

## 2023-10-14 MED ORDER — CICLOPIROX 8 % EX SOLN: Freq: Every day | CUTANEOUS | 9 refills | Status: AC

## 2023-10-14 NOTE — Progress Notes (Signed)
 Patient presents follow-up ulceration hallux right.  Doing better.   Physical exam:  General appearance: Pleasant, and in no acute distress. AOx3.  Vascular: Pedal pulses: DP 2/4 bilaterally, PT 2/4 bilaterally.  Moderate edema lower legs bilaterally. Capillary fill time immediate.  Neurological: Grossly intact bilaterally  Dermatologic:   Ulcer healed hallux right.  No signs of infection.  Nail hallux right with some dystrophic changes possibly fungal.  Other nails might have some early fungal changes in the distal portion of the nail  Musculoskeletal:     Diagnosis: 1.  Onychomycosis hallux nail 1 through 5 bilaterally  Plan: -Established office visit for evaluation and management.  Level 3. -Discussed with the onychomycosis would recommend a topical try this first.  Discussed onychomycosis versus dystrophic changes of the nail.  We have a prescription for Penlac .  She will have to use this for 9 to 12 months. -Rx Penlac  solution, applied daily to affected nails, refill x 9  Return as needed

## 2023-10-16 ENCOUNTER — Ambulatory Visit
Admission: RE | Admit: 2023-10-16 | Discharge: 2023-10-16 | Disposition: A | Discharge status: Home / Self Care | Source: Ambulatory Visit | Attending: Family Medicine | Admitting: Family Medicine

## 2023-10-16 DIAGNOSIS — Z1231 Encounter for screening mammogram for malignant neoplasm of breast: Secondary | ICD-10-CM
